# Patient Record
Sex: Female | Born: 1945 | Race: White | Hispanic: No | Marital: Married | Smoking: Never smoker
Health system: Southern US, Community
[De-identification: ages and names within clinical notes are randomized; demographics above are authoritative.]

## PROBLEM LIST (undated history)

## (undated) DIAGNOSIS — I10 Essential (primary) hypertension: Secondary | ICD-10-CM

## (undated) DIAGNOSIS — E7800 Pure hypercholesterolemia, unspecified: Secondary | ICD-10-CM

## (undated) HISTORY — PX: MELANOMA EXCISION: SHX5266

## (undated) HISTORY — PX: WISDOM TOOTH EXTRACTION: SHX21

## (undated) HISTORY — PX: LIPOMA EXCISION: SHX5283

## (undated) HISTORY — PX: OTHER SURGICAL HISTORY: SHX169

---

## 2012-09-05 ENCOUNTER — Ambulatory Visit: Payer: Self-pay | Admitting: Emergency Medicine

## 2015-02-11 ENCOUNTER — Encounter: Payer: Self-pay | Admitting: *Deleted

## 2015-02-11 ENCOUNTER — Ambulatory Visit
Admission: EM | Admit: 2015-02-11 | Discharge: 2015-02-11 | Disposition: A | Discharge status: Home / Self Care | Payer: Medicare Other | Attending: Family Medicine | Admitting: Family Medicine

## 2015-02-11 DIAGNOSIS — R05 Cough: Secondary | ICD-10-CM

## 2015-02-11 DIAGNOSIS — J0100 Acute maxillary sinusitis, unspecified: Secondary | ICD-10-CM

## 2015-02-11 DIAGNOSIS — R059 Cough, unspecified: Secondary | ICD-10-CM

## 2015-02-11 HISTORY — DX: Pure hypercholesterolemia, unspecified: E78.00

## 2015-02-11 HISTORY — DX: Essential (primary) hypertension: I10

## 2015-02-11 MED ORDER — HYDROCOD POLST-CPM POLST ER 10-8 MG/5ML PO SUER: 5.0000 mL | Freq: Every evening | ORAL | Status: DC | PRN

## 2015-02-11 MED ORDER — AZITHROMYCIN 250 MG PO TABS: ORAL_TABLET | ORAL | Status: DC

## 2015-02-11 NOTE — ED Notes (Signed)
Patient has had symptoms of nasal congestion and cough for two weeks with no relief using OTC drugs. Patient states that the congestion started tightening up last PM and is no longer loose. Color of congestion is clear. No symptoms of sore throat.

## 2015-02-11 NOTE — ED Provider Notes (Signed)
CSN: [id number]     Arrival date & time 02/11/15  0807 History   First MD Initiated Contact with Patient 02/11/15 [phone]     Chief Complaint  Patient presents with  . Nasal Congestion  . Cough   (Consider location/radiation/quality/duration/timing/severity/associated sxs/prior Treatment) Patient is a 69 y.o. female presenting with URI. The history is provided by the patient.  URI Presenting symptoms: congestion, cough and facial pain   Onset quality:  Sudden Duration:  2 weeks Timing:  Constant Progression:  Worsening Chronicity:  New Ineffective treatments:  OTC medications Associated symptoms: sinus pain   Associated symptoms: no headaches     Past Medical History  Diagnosis Date  . Hypertension   . Hypercholesteremia    Past Surgical History  Procedure Laterality Date  . Lipoma excision Right     buttock  . Wisdom tooth extraction     History reviewed. No pertinent family history. Social History  Substance Use Topics  . Smoking status: Never Smoker   . Smokeless tobacco: Never Used  . Alcohol Use: No   OB History    No data available     Review of Systems  HENT: Positive for congestion.   Respiratory: Positive for cough.   Neurological: Negative for headaches.    Allergies  Review of patient's allergies indicates no known allergies.  Home Medications   Prior to Admission medications   Medication Sig Start Date End Date Taking? Authorizing Provider  hydrochlorothiazide (MICROZIDE) 12.5 MG capsule Take 12.5 mg by mouth daily.   Yes Historical Provider, MD  rosuvastatin (CRESTOR) 5 MG tablet Take 5 mg by mouth daily.   Yes Historical Provider, MD  azithromycin (ZITHROMAX Z-PAK) 250 MG tablet 2 tabs po once day 1, then 1 tab po qd for next 4 days 02/11/15   Payton Mccallum, MD  chlorpheniramine-HYDROcodone [facility]) 10-8 MG/5ML SUER Take 5 mLs by mouth at bedtime as needed for cough. 02/11/15   Payton Mccallum, MD   Meds Ordered and  Administered this Visit  Medications - No data to display  BP 164/93 mmHg  Pulse 71  Temp(Src) 97.7 F (36.5 C) (Oral)  Resp 20  Ht 5' 5.5" (1.664 m)  Wt 160 lb (72.576 kg)  BMI 26.21 kg/m2  SpO2 95% No data found.   Physical Exam  Constitutional: She appears well-developed and well-nourished. No distress.  HENT:  Head: Normocephalic and atraumatic.  Right Ear: Tympanic membrane, external ear and ear canal normal.  Left Ear: Tympanic membrane, external ear and ear canal normal.  Nose: Mucosal edema and rhinorrhea present. No nose lacerations, sinus tenderness, nasal deformity, septal deviation or nasal septal hematoma. No epistaxis.  No foreign bodies. Right sinus exhibits maxillary sinus tenderness and frontal sinus tenderness. Left sinus exhibits maxillary sinus tenderness and frontal sinus tenderness.  Mouth/Throat: Uvula is midline, oropharynx is clear and moist and mucous membranes are normal. No oropharyngeal exudate.  Eyes: Conjunctivae and EOM are normal. Pupils are equal, round, and reactive to light. Right eye exhibits no discharge. Left eye exhibits no discharge. No scleral icterus.  Neck: Normal range of motion. Neck supple. No thyromegaly present.  Cardiovascular: Normal rate, regular rhythm and normal heart sounds.   Pulmonary/Chest: Effort normal and breath sounds normal. No respiratory distress. She has no wheezes. She has no rales.  Lymphadenopathy:    She has no cervical adenopathy.  Skin: No rash noted. She is not diaphoretic.  Nursing note and vitals reviewed.   ED Course  Procedures (including  critical care time)  Labs Review Labs Reviewed - No data to display  Imaging Review No results found.   Visual Acuity Review  Right Eye Distance:   Left Eye Distance:   Bilateral Distance:    Right Eye Near:   Left Eye Near:    Bilateral Near:         MDM   1. Acute maxillary sinusitis, recurrence not specified   2. Cough    Discharge Medication  List as of 02/11/2015  9:02 AM    START taking these medications   Details  azithromycin (ZITHROMAX Z-PAK) 250 MG tablet 2 tabs po once day 1, then 1 tab po qd for next 4 days, Normal    chlorpheniramine-HYDROcodone ([facility]) 10-8 MG/5ML SUER Take 5 mLs by mouth at bedtime as needed for cough., Starting 02/11/2015, Until Discontinued, Normal      1.  diagnosis reviewed with patient/parent/guardian/family 2. rx as per orders above; reviewed possible side effects, interactions, risks and benefits  3. Recommend supportive treatment with rest, increased fluids  4. Follow-up prn if symptoms worsen or don't improve    Payton Mccallumrlando Edelmira Gallogly, MD 02/11/15 [phone]

## 2019-03-11 ENCOUNTER — Ambulatory Visit: Payer: Medicare PPO | Admitting: Family Medicine

## 2019-03-11 ENCOUNTER — Encounter: Payer: Self-pay | Admitting: Family Medicine

## 2019-03-11 ENCOUNTER — Other Ambulatory Visit: Payer: Self-pay

## 2019-03-11 VITALS — BP 150/100 | HR 64 | Ht 65.5 in | Wt 149.0 lb

## 2019-03-11 DIAGNOSIS — Z85820 Personal history of malignant melanoma of skin: Secondary | ICD-10-CM

## 2019-03-11 DIAGNOSIS — I10 Essential (primary) hypertension: Secondary | ICD-10-CM | POA: Diagnosis not present

## 2019-03-11 DIAGNOSIS — Z7689 Persons encountering health services in other specified circumstances: Secondary | ICD-10-CM

## 2019-03-11 DIAGNOSIS — Z23 Encounter for immunization: Secondary | ICD-10-CM

## 2019-03-11 DIAGNOSIS — E7801 Familial hypercholesterolemia: Secondary | ICD-10-CM | POA: Diagnosis not present

## 2019-03-11 DIAGNOSIS — R69 Illness, unspecified: Secondary | ICD-10-CM

## 2019-03-11 MED ORDER — FISH OIL 435 MG PO CAPS: 1.0000 | ORAL_CAPSULE | Freq: Two times a day (BID) | ORAL | 11 refills | Status: AC

## 2019-03-11 MED ORDER — HYDROCHLOROTHIAZIDE 12.5 MG PO CAPS: 12.5000 mg | ORAL_CAPSULE | Freq: Every day | ORAL | 1 refills | Status: DC

## 2019-03-11 MED ORDER — ROSUVASTATIN CALCIUM 5 MG PO TABS: 5.0000 mg | ORAL_TABLET | Freq: Every day | ORAL | 1 refills | Status: DC

## 2019-03-11 NOTE — Progress Notes (Signed)
Date:  03/11/2019   Name:  Amy Gonzales   DOB:  Sep 06, 1945   MRN:  [id number]   Chief Complaint: Establish Care, ref derm (yearly skin checks), and pneum 23  Patient is a 74 year old female who presents for a establihment  New patient exam. The patient reports the following problems: htn/hyperlipedia. Health maintenance has been reviewed pneumococcal.  Hypertension This is a chronic problem. The current episode started more than 1 year ago. The problem has been waxing and waning since onset. The problem is controlled (hx white coat syndrome). Pertinent negatives include no anxiety, blurred vision, chest pain, headaches, malaise/fatigue, neck pain, orthopnea, palpitations, peripheral edema, PND, shortness of breath or sweats. There are no associated agents to hypertension. Risk factors for coronary artery disease include dyslipidemia and post-menopausal state. There is no history of angina, kidney disease, CAD/MI, CVA, heart failure, left ventricular hypertrophy, PVD or retinopathy. There is no history of chronic renal disease, a hypertension causing med or renovascular disease.  Hyperlipidemia This is a chronic problem. The current episode started more than 1 year ago. The problem is controlled. Recent lipid tests were reviewed and are normal. She has no history of chronic renal disease, diabetes, hypothyroidism, liver disease, obesity or nephrotic syndrome. Factors aggravating her hyperlipidemia include thiazides. Pertinent negatives include no chest pain, focal sensory loss, focal weakness, leg pain, myalgias or shortness of breath. Current antihyperlipidemic treatment includes statins. The current treatment provides moderate improvement of lipids. There are no compliance problems.  Risk factors for coronary artery disease include dyslipidemia and hypertension.    No results found for: CREATININE, BUN, NA, K, CL, CO2 No results found for: CHOL, HDL, LDLCALC, LDLDIRECT, TRIG, CHOLHDL No  results found for: TSH No results found for: HGBA1C   Review of Systems  Constitutional: Negative.  Negative for chills, fatigue, fever, malaise/fatigue and unexpected weight change.  HENT: Negative for congestion, ear discharge, ear pain, rhinorrhea, sinus pressure, sneezing and sore throat.   Eyes: Negative for blurred vision, photophobia, pain, discharge, redness and itching.  Respiratory: Negative for cough, shortness of breath, wheezing and stridor.   Cardiovascular: Negative for chest pain, palpitations, orthopnea, leg swelling and PND.  Gastrointestinal: Negative for abdominal pain, blood in stool, constipation, diarrhea, nausea and vomiting.  Endocrine: Negative for cold intolerance, heat intolerance, polydipsia, polyphagia and polyuria.  Genitourinary: Negative for dysuria, flank pain, frequency, hematuria, menstrual problem, pelvic pain, urgency, vaginal bleeding and vaginal discharge.  Musculoskeletal: Negative for arthralgias, back pain, myalgias and neck pain.  Skin: Negative for rash.  Allergic/Immunologic: Negative for environmental allergies and food allergies.  Neurological: Negative for dizziness, focal weakness, weakness, light-headedness, numbness and headaches.  Hematological: Negative for adenopathy. Does not bruise/bleed easily.  Psychiatric/Behavioral: Negative for dysphoric mood. The patient is not nervous/anxious.     There are no problems to display for this patient.   No Known Allergies  Past Surgical History:  Procedure Laterality Date  . 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 LIPOMA EXCISION Right    buttock  . MELANOMA EXCISION     face  . WISDOM TOOTH EXTRACTION      Social History   Tobacco Use  . Smoking status: Never Smoker  . Smokeless tobacco: Never Used  Substance Use Topics  . Alcohol use: No  . Drug use: No     Medication list has been reviewed and updated.  Current Meds  Medication Sig  . cholecalciferol (VITAMIN D3) 25  MCG (  1000 UNIT) tablet Take 1,000 Units by mouth daily.  . Coenzyme Q10 (COQ10) 100 MG CAPS Take 1 capsule by mouth daily.  . Evening Primrose Oil 1000 MG CAPS Take 1 capsule by mouth 2 (two) times daily.  . folic acid (FOLVITE) 400 MCG tablet Take 400 mcg by mouth daily.  . Glucos-Chondroit-Hyaluron-MSM (GLUCOSAMINE CHONDROITIN JOINT PO) Take 1 tablet by mouth 2 (two) times daily.  . hydrochlorothiazide (MICROZIDE) 12.5 MG capsule Take 12.5 mg by mouth daily.  . Magnesium 250 MG TABS Take 1 tablet by mouth daily.  . Multiple Vitamins-Minerals (CENTRUM SILVER PO) Take 1 tablet by mouth daily.  . Omega-3 Fatty Acids (FISH OIL) 435 MG CAPS Take 1 capsule by mouth 2 (two) times daily.  . rosuvastatin (CRESTOR) 5 MG tablet Take 5 mg by mouth daily.    PHQ 2/9 Scores 03/11/2019  PHQ - 2 Score 0  PHQ- 9 Score 0    BP Readings from Last 3 Encounters:  03/11/19 (!) 150/100  02/11/15 (!) 164/93    Physical Exam Vitals and nursing note reviewed.  Constitutional:      General: She is not in acute distress.    Appearance: She is not diaphoretic.  HENT:     Head: Normocephalic and atraumatic.     Right Ear: Tympanic membrane, ear canal and external ear normal.     Left Ear: Tympanic membrane, ear canal and external ear normal.     Nose: Nose normal.  Eyes:     General:        Right eye: No discharge.        Left eye: No discharge.     Conjunctiva/sclera: Conjunctivae normal.     Pupils: Pupils are equal, round, and reactive to light.  Neck:     Thyroid: No thyromegaly.     Vascular: No JVD.  Cardiovascular:     Rate and Rhythm: Normal rate and regular rhythm.     Pulses: Normal pulses.     Heart sounds: Normal heart sounds. No murmur. No friction rub. No gallop.   Pulmonary:     Effort: Pulmonary effort is normal. No respiratory distress.     Breath sounds: Normal breath sounds. No stridor. No wheezing, rhonchi or rales.  Chest:     Chest wall: No tenderness.  Abdominal:      General: Bowel sounds are normal.     Palpations: Abdomen is soft. There is no mass.     Tenderness: There is no abdominal tenderness. There is no guarding.  Musculoskeletal:        General: Normal range of motion.     Cervical back: Normal range of motion and neck supple.  Lymphadenopathy:     Cervical: No cervical adenopathy.  Skin:    General: Skin is warm and dry.     Capillary Refill: Capillary refill takes less than 2 seconds.     Findings: No bruising or erythema.  Neurological:     Mental Status: She is alert.     Deep Tendon Reflexes: Reflexes are normal and symmetric.     Wt Readings from Last 3 Encounters:  03/11/19 149 lb (67.6 kg)  02/11/15 160 lb (72.6 kg)    BP (!) 150/100   Pulse 64   Ht 5' 5.5" (1.664 m)   Wt 149 lb (67.6 kg)   BMI 24.42 kg/m   Assessment and Plan: 1. Establishing care with new doctor, encounter for No subjective/objective concerns with review of patient's history and physical.  Patient's relatively sparse previous encounters were evaluated there is no labs, imaging, specialty notes to review.  2. Essential hypertension Chronic.  Controlled.  Stable.  Continue hydrochlorothiazide 12.5 mg once a day.  Patient does relate that she has a history of whitecoat syndrome and always had an elevated reading in physician's offices but is able to provide normal blood pressure readings at home.  We will refill hydrochlorothiazide 12.5 mg once a day and have discussed the possibility of investigating with ambulatory blood pressure readings. - Omega-3 Fatty Acids (FISH OIL) 435 MG CAPS; Take 1 capsule (435 mg total) by mouth 2 (two) times daily.  Dispense: 60 capsule; Refill: 11 - hydrochlorothiazide (MICROZIDE) 12.5 MG capsule; Take 1 capsule (12.5 mg total) by mouth daily.  Dispense: 90 capsule; Refill: 1 - Renal Function Panel  3. Familial hypercholesterolemia Chronic.  Controlled.  Stable.  Patient is currently on rosuvastatin 5 mg once a day.  We will  check a lipid panel for evaluation of results. - rosuvastatin (CRESTOR) 5 MG tablet; Take 1 tablet (5 mg total) by mouth daily.  Dispense: 90 tablet; Refill: 1 - Lipid Panel With LDL/HDL Ratio  4. 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  Patient desires a referral to dermatology and Dr. Cheree Ditto has been consulted for evaluation of mole checks and treatment if necessary. - Ambulatory referral to Dermatology  5. Taking medication for chronic disease Patient currently on several medications including a statin for which we will evaluate hepatic function. - Hepatic function panel  6. Need for pneumococcal vaccination Discussed and administered patient has had a pneumococcal 13 but we cannot find evidence of a 23 in the past. - Pneumococcal polysaccharide vaccine 23-valent greater than or equal to 2yo subcutaneous/IM

## 2019-03-12 LAB — RENAL FUNCTION PANEL
Albumin: 4.5 g/dL (ref 3.7–4.7)
BUN/Creatinine Ratio: 16 (ref 12–28)
BUN: 12 mg/dL (ref 8–27)
CO2: 24 mmol/L (ref 20–29)
Calcium: 10.1 mg/dL (ref 8.7–10.3)
Chloride: 98 mmol/L (ref 96–106)
Creatinine, Ser: 0.75 mg/dL (ref 0.57–1.00)
GFR calc Af Amer: 91 mL/min/{1.73_m2} (ref >59)
GFR calc non Af Amer: 79 mL/min/{1.73_m2} (ref >59)
Glucose: 90 mg/dL (ref 65–99)
Phosphorus: 3.6 mg/dL (ref 3.0–4.3)
Potassium: 4 mmol/L (ref 3.5–5.2)
Sodium: 138 mmol/L (ref 134–144)

## 2019-03-12 LAB — HEPATIC FUNCTION PANEL
ALT: 13 IU/L (ref 0–32)
AST: 18 IU/L (ref 0–40)
Alkaline Phosphatase: 57 IU/L (ref 39–117)
Bilirubin Total: 0.3 mg/dL (ref 0.0–1.2)
Bilirubin, Direct: 0.11 mg/dL (ref 0.00–0.40)
Total Protein: 6.5 g/dL (ref 6.0–8.5)

## 2019-03-12 LAB — LIPID PANEL WITH LDL/HDL RATIO
Cholesterol, Total: 183 mg/dL (ref 100–199)
HDL: 85 mg/dL (ref >39)
LDL Chol Calc (NIH): 78 mg/dL (ref 0–99)
LDL/HDL Ratio: 0.9 ratio (ref 0.0–3.2)
Triglycerides: 116 mg/dL (ref 0–149)
VLDL Cholesterol Cal: 20 mg/dL (ref 5–40)

## 2019-03-26 ENCOUNTER — Other Ambulatory Visit: Payer: Self-pay

## 2019-03-26 ENCOUNTER — Ambulatory Visit: Payer: Self-pay | Attending: Internal Medicine

## 2019-03-26 DIAGNOSIS — Z23 Encounter for immunization: Secondary | ICD-10-CM | POA: Insufficient documentation

## 2019-03-26 NOTE — Progress Notes (Signed)
   Covid-19 Vaccination Clinic  Name:  Amy Gonzales    MRN: [id number] DOB: November 01, 1945  03/26/2019  Amy Gonzales was observed post Covid-19 immunization for 15 minutes without incidence. She was provided with Vaccine Information Sheet and instruction to access the V-Safe system.   Amy Gonzales was instructed to call 911 with any severe reactions post vaccine: Marland Kitchen Difficulty breathing  . Swelling of your face and throat  . A fast heartbeat  . A bad rash all over your body  . Dizziness and weakness    Immunizations Administered    Name Date Dose VIS Date Route   Moderna COVID-19 Vaccine 03/26/2019  5:15 PM 0.5 mL 01/19/2019 Intramuscular   Manufacturer: Moderna   Lot: [id number]   NDC: 88416-606-30

## 2019-04-27 ENCOUNTER — Ambulatory Visit: Payer: Self-pay | Attending: Internal Medicine

## 2019-04-27 DIAGNOSIS — Z23 Encounter for immunization: Secondary | ICD-10-CM | POA: Insufficient documentation

## 2019-04-27 NOTE — Progress Notes (Signed)
   Covid-19 Vaccination Clinic  Name:  Amy Gonzales    MRN: [id number] DOB: [birth year]  04/27/2019  [name] was observed post Covid-19 immunization for 15 minutes without incident. She was provided with Vaccine Information Sheet and instruction to access the V-Safe system.   [name] was instructed to call 911 with any severe reactions post vaccine: Marland Kitchen Difficulty breathing  . Swelling of face and throat  . A fast heartbeat  . A bad rash all over body  . Dizziness and weakness   Immunizations Administered    Name Date Dose VIS Date Route   Moderna COVID-19 Vaccine 04/27/2019  4:22 PM 0.5 mL 01/19/2019 Intramuscular   Manufacturer: Moderna   Lot: [id number]   NDC: 14840-397-95

## 2019-05-04 ENCOUNTER — Encounter: Payer: Self-pay | Admitting: Family Medicine

## 2019-05-04 ENCOUNTER — Ambulatory Visit: Payer: Medicare PPO | Admitting: Family Medicine

## 2019-05-04 ENCOUNTER — Other Ambulatory Visit: Payer: Self-pay

## 2019-05-04 VITALS — BP 160/100 | HR 88 | Ht 65.5 in | Wt 148.0 lb

## 2019-05-04 DIAGNOSIS — L309 Dermatitis, unspecified: Secondary | ICD-10-CM

## 2019-05-04 MED ORDER — PREDNISONE 10 MG PO TABS: ORAL_TABLET | ORAL | 1 refills | Status: DC

## 2019-05-04 MED ORDER — TRIAMCINOLONE ACETONIDE 0.1 % EX CREA: 1.0000 "application " | TOPICAL_CREAM | Freq: Two times a day (BID) | CUTANEOUS | 0 refills | Status: DC

## 2019-05-04 NOTE — Progress Notes (Signed)
Date:  05/04/2019   Name:  Amy Gonzales   DOB:  October 09, 1945   MRN:  [id number]   Chief Complaint: Rash (pulled weeds and yard clearing- red, raised areas in some cluster- like shapes)  Rash This is a new problem. The current episode started in the past 7 days. The problem has been gradually worsening since onset. The affected locations include the left arm and abdomen. The rash is characterized by itchiness and redness. Associated with: vegetative clippings. Pertinent negatives include no anorexia, congestion, cough, diarrhea, eye pain, facial edema, fatigue, fever, joint pain, nail changes, rhinorrhea, shortness of breath, sore throat or vomiting.    Lab Results  Component Value Date   CREATININE 0.75 03/11/2019   BUN 12 03/11/2019   NA 138 03/11/2019   K 4.0 03/11/2019   CL 98 03/11/2019   CO2 24 03/11/2019   Lab Results  Component Value Date   CHOL 183 03/11/2019   HDL 85 03/11/2019   LDLCALC 78 03/11/2019   TRIG 116 03/11/2019   No results found for: TSH No results found for: HGBA1C No results found for: WBC, HGB, HCT, MCV, PLT Lab Results  Component Value Date   ALT 13 03/11/2019   AST 18 03/11/2019   ALKPHOS 57 03/11/2019   BILITOT 0.3 03/11/2019     Review of Systems  Constitutional: Negative.  Negative for chills, fatigue, fever and unexpected weight change.  HENT: Negative for congestion, ear discharge, ear pain, rhinorrhea, sinus pressure, sneezing and sore throat.   Eyes: Negative for photophobia, pain, discharge, redness and itching.  Respiratory: Negative for cough, shortness of breath, wheezing and stridor.   Gastrointestinal: Negative for abdominal pain, anorexia, blood in stool, constipation, diarrhea, nausea and vomiting.  Endocrine: Negative for cold intolerance, heat intolerance, polydipsia, polyphagia and polyuria.  Genitourinary: Negative for dysuria, flank pain, frequency, hematuria, menstrual problem, pelvic pain, urgency, vaginal bleeding and  vaginal discharge.  Musculoskeletal: Negative for arthralgias, back pain, joint pain and myalgias.  Skin: Positive for rash. Negative for nail changes.  Allergic/Immunologic: Negative for environmental allergies and food allergies.  Neurological: Negative for dizziness, weakness, light-headedness, numbness and headaches.  Hematological: Negative for adenopathy. Does not bruise/bleed easily.  Psychiatric/Behavioral: Negative for dysphoric mood. The patient is not nervous/anxious.     There are no problems to display for this patient.   No Known Allergies  Past Surgical History:  Procedure Laterality Date  . 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 LIPOMA EXCISION Right    buttock  . MELANOMA EXCISION     face  . WISDOM TOOTH EXTRACTION      Social History   Tobacco Use  . Smoking status: Never Smoker  . Smokeless tobacco: Never Used  Substance Use Topics  . Alcohol use: No  . Drug use: No     Medication list has been reviewed and updated.  Current Meds  Medication Sig  . cholecalciferol (VITAMIN D3) 25 MCG (1000 UNIT) tablet Take 1,000 Units by mouth daily.  . Coenzyme Q10 (COQ10) 100 MG CAPS Take 1 capsule by mouth daily.  . Evening Primrose Oil 1000 MG CAPS Take 1 capsule by mouth 2 (two) times daily.  . folic acid (FOLVITE) 400 MCG tablet Take 400 mcg by mouth daily.  . Glucos-Chondroit-Hyaluron-MSM (GLUCOSAMINE CHONDROITIN JOINT PO) Take 1 tablet by mouth 2 (two) times daily.  . hydrochlorothiazide (MICROZIDE) 12.5 MG capsule Take 1 capsule (12.5 mg total) by mouth daily.  . Magnesium  250 MG TABS Take 1 tablet by mouth daily.  . Multiple Vitamins-Minerals (CENTRUM SILVER PO) Take 1 tablet by mouth daily.  . Omega-3 Fatty Acids (FISH OIL) 435 MG CAPS Take 1 capsule (435 mg total) by mouth 2 (two) times daily.  . rosuvastatin (CRESTOR) 5 MG tablet Take 1 tablet (5 mg total) by mouth daily.    PHQ 2/9 Scores 03/11/2019  PHQ - 2 Score 0  PHQ- 9 Score 0    BP  Readings from Last 3 Encounters:  05/04/19 (!) 160/100  03/11/19 (!) 150/100  02/11/15 (!) 164/93    Physical Exam Vitals and nursing note reviewed.  Constitutional:      General: She is not in acute distress.    Appearance: She is not diaphoretic.  HENT:     Head: Normocephalic and atraumatic.     Right Ear: Tympanic membrane, ear canal and external ear normal.     Left Ear: Tympanic membrane, ear canal and external ear normal.     Nose: Nose normal. No congestion or rhinorrhea.     Mouth/Throat:     Mouth: Mucous membranes are moist.  Eyes:     General:        Right eye: No discharge.        Left eye: No discharge.     Conjunctiva/sclera: Conjunctivae normal.     Pupils: Pupils are equal, round, and reactive to light.  Neck:     Thyroid: No thyromegaly.     Vascular: No JVD.  Cardiovascular:     Rate and Rhythm: Normal rate and regular rhythm.     Heart sounds: Normal heart sounds. No murmur. No friction rub. No gallop.   Pulmonary:     Effort: Pulmonary effort is normal.     Breath sounds: Normal breath sounds. No wheezing or rhonchi.  Abdominal:     General: Bowel sounds are normal.     Palpations: Abdomen is soft. There is no mass.     Tenderness: There is no abdominal tenderness. There is no guarding.  Musculoskeletal:        General: Normal range of motion.     Cervical back: Normal range of motion and neck supple.  Lymphadenopathy:     Cervical: No cervical adenopathy.  Skin:    General: Skin is warm and dry.     Capillary Refill: Capillary refill takes less than 2 seconds.     Findings: Erythema and rash present.  Neurological:     Mental Status: She is alert.     Deep Tendon Reflexes: Reflexes are normal and symmetric.     Wt Readings from Last 3 Encounters:  05/04/19 148 lb (67.1 kg)  03/11/19 149 lb (67.6 kg)  02/11/15 160 lb (72.6 kg)    BP (!) 160/100   Pulse 88   Ht 5' 5.5" (1.664 m)   Wt 148 lb (67.1 kg)   BMI 24.25 kg/m   Assessment  and Plan: 1. Dermatitis Acute.  Persistent.  Patient has areas on her arms face and across abdomen is suggestive of contact dermatitis but does not know of any exposure to poison oak although she has been working out in the yard I would not be surprised this is the underlying cause or of an insect nature.  We will use triamcinolone cream 0.1% and I rather doubt that this is going to take care of it but patient would like to try this first because she is cautious about the same.  In  the meantime I will give her a printed prescription for prednisone taper beginning at 60 mg to taper over 2 weeks as her backup.  For itching patient has been instructed to use a nonsedating antihistamine like loratadine during the day and Benadryl 25 mg at night. - triamcinolone cream (KENALOG) 0.1 %; Apply 1 application topically 2 (two) times daily.  Dispense: 453.6 g; Refill: 0   Addendum patient's blood pressure was noted to be elevated again and she feels that it is whitecoat directed.  I rather doubt that it is entirely that I think she does have some underlying hypertension but I checked her GFR and creatinine and they are well within normal limits certainly for her age therefore we will keep an eye on this and do Dash diet and watch her sodium and treated from the standpoint of commonsense dietary means.

## 2019-06-10 DIAGNOSIS — L578 Other skin changes due to chronic exposure to nonionizing radiation: Secondary | ICD-10-CM | POA: Diagnosis not present

## 2019-06-10 DIAGNOSIS — Z85820 Personal history of malignant melanoma of skin: Secondary | ICD-10-CM | POA: Diagnosis not present

## 2019-06-10 DIAGNOSIS — L2389 Allergic contact dermatitis due to other agents: Secondary | ICD-10-CM | POA: Diagnosis not present

## 2019-06-10 DIAGNOSIS — L570 Actinic keratosis: Secondary | ICD-10-CM | POA: Diagnosis not present

## 2019-09-09 ENCOUNTER — Other Ambulatory Visit: Payer: Self-pay

## 2019-09-09 ENCOUNTER — Encounter: Payer: Self-pay | Admitting: Family Medicine

## 2019-09-09 ENCOUNTER — Ambulatory Visit: Payer: Medicare PPO | Admitting: Family Medicine

## 2019-09-09 VITALS — BP 130/80 | HR 80 | Ht 65.5 in | Wt 148.0 lb

## 2019-09-09 DIAGNOSIS — J0100 Acute maxillary sinusitis, unspecified: Secondary | ICD-10-CM | POA: Diagnosis not present

## 2019-09-09 MED ORDER — AZITHROMYCIN 250 MG PO TABS: ORAL_TABLET | ORAL | 0 refills | Status: DC

## 2019-09-09 NOTE — Progress Notes (Signed)
Date:  09/09/2019   Name:  Amy Gonzales   DOB:  Aug 08, 1945   MRN:  [id number]   Chief Complaint: Sinusitis (head feels heavy and full, no drainage, pressure to L) ear. no cough- trying Sudafed otc )  Sinusitis This is a new problem. The current episode started in the past 7 days. The problem has been waxing and waning since onset. There has been no fever. The pain is moderate. Associated symptoms include congestion, ear pain and sinus pressure. Pertinent negatives include no chills, coughing, diaphoresis, headaches, hoarse voice, neck pain, shortness of breath, sneezing, sore throat or swollen glands. Past treatments include oral decongestants and saline sprays. The treatment provided mild relief.    Lab Results  Component Value Date   CREATININE 0.75 03/11/2019   BUN 12 03/11/2019   NA 138 03/11/2019   K 4.0 03/11/2019   CL 98 03/11/2019   CO2 24 03/11/2019   Lab Results  Component Value Date   CHOL 183 03/11/2019   HDL 85 03/11/2019   LDLCALC 78 03/11/2019   TRIG 116 03/11/2019   No results found for: TSH No results found for: HGBA1C No results found for: WBC, HGB, HCT, MCV, PLT Lab Results  Component Value Date   ALT 13 03/11/2019   AST 18 03/11/2019   ALKPHOS 57 03/11/2019   BILITOT 0.3 03/11/2019     Review of Systems  Constitutional: Negative.  Negative for chills, diaphoresis, fatigue, fever and unexpected weight change.  HENT: Positive for congestion, ear pain and sinus pressure. Negative for ear discharge, hoarse voice, rhinorrhea, sneezing and sore throat.   Eyes: Negative for photophobia, pain, discharge, redness and itching.  Respiratory: Negative for cough, shortness of breath, wheezing and stridor.   Cardiovascular: Negative for chest pain, palpitations and leg swelling.  Gastrointestinal: Negative for abdominal pain, blood in stool, constipation, diarrhea, nausea and vomiting.  Endocrine: Negative for cold intolerance, heat intolerance, polydipsia,  polyphagia and polyuria.  Genitourinary: Negative for dysuria, flank pain, frequency, hematuria, menstrual problem, pelvic pain, urgency, vaginal bleeding and vaginal discharge.  Musculoskeletal: Negative for arthralgias, back pain, myalgias and neck pain.  Skin: Negative for rash.  Allergic/Immunologic: Negative for environmental allergies and food allergies.  Neurological: Negative for dizziness, weakness, light-headedness, numbness and headaches.  Hematological: Negative for adenopathy. Does not bruise/bleed easily.  Psychiatric/Behavioral: Negative for dysphoric mood. The patient is not nervous/anxious.     There are no problems to display for this patient.   No Known Allergies  Past Surgical History:  Procedure Laterality Date  . 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 LIPOMA EXCISION Right    buttock  . MELANOMA EXCISION     face  . WISDOM TOOTH EXTRACTION      Social History   Tobacco Use  . Smoking status: Never Smoker  . Smokeless tobacco: Never Used  Substance Use Topics  . Alcohol use: No  . Drug use: No     Medication list has been reviewed and updated.  Current Meds  Medication Sig  . cholecalciferol (VITAMIN D3) 25 MCG (1000 UNIT) tablet Take 1,000 Units by mouth daily.  . Coenzyme Q10 (COQ10) 100 MG CAPS Take 1 capsule by mouth daily.  . Evening Primrose Oil 1000 MG CAPS Take 1 capsule by mouth 2 (two) times daily.  . folic acid (FOLVITE) 400 MCG tablet Take 400 mcg by mouth daily.  . Glucos-Chondroit-Hyaluron-MSM (GLUCOSAMINE CHONDROITIN JOINT PO) Take 1 tablet by mouth 2 (two)  times daily.  . hydrochlorothiazide (MICROZIDE) 12.5 MG capsule Take 1 capsule (12.5 mg total) by mouth daily.  . Magnesium 250 MG TABS Take 1 tablet by mouth daily.  . Multiple Vitamins-Minerals (CENTRUM SILVER PO) Take 1 tablet by mouth daily.  . Omega-3 Fatty Acids (FISH OIL) 435 MG CAPS Take 1 capsule (435 mg total) by mouth 2 (two) times daily.  . rosuvastatin (CRESTOR) 5  MG tablet Take 1 tablet (5 mg total) by mouth daily.  Marland Kitchen triamcinolone cream (KENALOG) 0.1 % Apply 1 application topically 2 (two) times daily.    PHQ 2/9 Scores 09/09/2019 03/11/2019  PHQ - 2 Score 0 0  PHQ- 9 Score 0 0    GAD 7 : Generalized Anxiety Score 09/09/2019 03/11/2019  Nervous, Anxious, on Edge 0 0  Control/stop worrying 0 0  Worry too much - different things 0 0  Trouble relaxing 0 0  Restless 0 0  Easily annoyed or irritable 0 0  Afraid - awful might happen 0 0  Total GAD 7 Score 0 0    BP Readings from Last 3 Encounters:  09/09/19 (!) 130/80  05/04/19 (!) 160/100  03/11/19 (!) 150/100    Physical Exam  Wt Readings from Last 3 Encounters:  09/09/19 148 lb (67.1 kg)  05/04/19 148 lb (67.1 kg)  03/11/19 149 lb (67.6 kg)    BP (!) 130/80   Pulse 80   Ht 5' 5.5" (1.664 m)   Wt 148 lb (67.1 kg)   BMI 24.25 kg/m   Assessment and Plan: 1. Acute non-recurrent maxillary sinusitis Acute.  Persistent.  Uncomplicated and relatively stable.  Will initiate a azithromycin to 50 mg 2 today followed by 1 a day for 4 days.  Patient will continue Sudafed for decongestant induced swelling.  Patient is also noted to have eustachian tube dysfunction with a retracted TM on the left - azithromycin (ZITHROMAX) 250 MG tablet; 2 today then 1 a day for 4 days  Dispense: 6 tablet; Refill: 0

## 2019-09-23 ENCOUNTER — Telehealth: Payer: Self-pay | Admitting: Family Medicine

## 2019-09-23 NOTE — Telephone Encounter (Signed)
Left message for patient to call back and schedule Medicare Annual Wellness Visit (AWV) either virtually/audio only or in office. Whichever the patients preference is.  No history of AWV; please schedule at anytime with [facility] Health Advisor.  This should be a 40 minute visit 

## 2019-09-28 ENCOUNTER — Other Ambulatory Visit: Payer: Self-pay

## 2019-09-28 ENCOUNTER — Ambulatory Visit
Admission: EM | Admit: 2019-09-28 | Discharge: 2019-09-28 | Disposition: A | Discharge status: Home / Self Care | Payer: Medicare PPO | Attending: Internal Medicine | Admitting: Internal Medicine

## 2019-09-28 ENCOUNTER — Ambulatory Visit: Payer: Self-pay

## 2019-09-28 DIAGNOSIS — R079 Chest pain, unspecified: Secondary | ICD-10-CM | POA: Diagnosis not present

## 2019-09-28 DIAGNOSIS — R1012 Left upper quadrant pain: Secondary | ICD-10-CM

## 2019-09-28 DIAGNOSIS — H6982 Other specified disorders of Eustachian tube, left ear: Secondary | ICD-10-CM

## 2019-09-28 MED ORDER — POLYETHYLENE GLYCOL 3350 17 G PO PACK: 17.0000 g | PACK | Freq: Every day | ORAL | 0 refills | Status: DC | PRN

## 2019-09-28 MED ORDER — SIMETHICONE 80 MG PO CHEW: 80.0000 mg | CHEWABLE_TABLET | Freq: Four times a day (QID) | ORAL | 0 refills | Status: DC | PRN

## 2019-09-28 NOTE — ED Triage Notes (Signed)
Patient in today w/ c/o epigastric pain and pain in left side of face below her ear. Patient states her neck "got hot and sweaty.' Sx onset last evening. Patient denies SOB, chest pain, loss of taste or smell.

## 2019-09-29 NOTE — ED Provider Notes (Addendum)
[facility]    CSN: [id number] Arrival date & time: 09/28/19  1351      History   Chief Complaint Chief Complaint  Patient presents with  . Abdominal Pain  . Facial Pain    Below left ear    HPI Amy Gonzales is a 74 y.o. female comes to the urgent care with complaints of epigastric pain, left-sided neck pain with some postnasal drainage.  Symptoms started last evening.  She comes to the urgent care to be evaluated.  Patient denies any chest pain or chest pressure.  No loss of taste or smell.  No fever or chills.  No shortness of breath.  Patient is fully vaccinated against COVID-19 virus.  She was worried about the possibility of heart attack and hence the visit.  Regarding the abdominal pain patient says that she feels more gassy.  She has had small bowel movements over the past several days.  She denies any abdominal distention, nausea or vomiting.  No weight loss.   HPI  Past Medical History:  Diagnosis Date  . Hypercholesteremia   . Hypertension     There are no problems to display for this patient.   Past Surgical History:  Procedure Laterality Date  . 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 LIPOMA EXCISION Right    buttock  . MELANOMA EXCISION     face  . WISDOM TOOTH EXTRACTION      OB History   No obstetric history on file.      Home Medications    Prior to Admission medications   Medication Sig Start Date End Date Taking? Authorizing Provider  cholecalciferol (VITAMIN D3) 25 MCG (1000 UNIT) tablet Take 1,000 Units by mouth daily.   Yes [provider]  Coenzyme Q10 (COQ10) 100 MG CAPS Take 1 capsule by mouth daily.   Yes [provider]  Evening Primrose Oil 1000 MG CAPS Take 1 capsule by mouth 2 (two) times daily.   Yes [provider]  folic acid (FOLVITE) 400 MCG tablet Take 400 mcg by mouth daily.   Yes [provider]  Glucos-Chondroit-Hyaluron-MSM (GLUCOSAMINE CHONDROITIN JOINT PO) Take 1 tablet  by mouth 2 (two) times daily.   Yes [provider]  hydrochlorothiazide (MICROZIDE) 12.5 MG capsule Take 1 capsule (12.5 mg total) by mouth daily. 03/11/19  Yes Duanne Limerick, MD  Magnesium 250 MG TABS Take 1 tablet by mouth daily.   Yes [provider]  Multiple Vitamins-Minerals (CENTRUM SILVER PO) Take 1 tablet by mouth daily.   Yes [provider]  Omega-3 Fatty Acids (FISH OIL) 435 MG CAPS Take 1 capsule (435 mg total) by mouth 2 (two) times daily. 03/11/19  Yes Duanne Limerick, MD  rosuvastatin (CRESTOR) 5 MG tablet Take 1 tablet (5 mg total) by mouth daily. 03/11/19  Yes Duanne Limerick, MD  polyethylene glycol (MIRALAX) 17 g packet Take 17 g by mouth daily as needed for moderate constipation. 09/28/19   Mando Blatz, Britta Mccreedy, MD  simethicone (GAS-X) 80 MG chewable tablet Chew 1 tablet (80 mg total) by mouth every 6 (six) hours as needed for flatulence. 09/28/19   LampteyBritta Mccreedy, MD    Family History Family History  Problem Relation Age of Onset  . Cancer Paternal Grandfather     Social History Social History   Tobacco Use  . Smoking status: Never Smoker  . Smokeless tobacco: Never Used  Substance Use Topics  . Alcohol  use: No  . Drug use: No     Allergies   Patient has no known allergies.   Review of Systems Review of Systems  Constitutional: Negative.   Respiratory: Negative.  Negative for chest tightness, shortness of breath and wheezing.   Cardiovascular: Negative.   Gastrointestinal: Positive for abdominal distention and abdominal pain. Negative for diarrhea, nausea and vomiting.     Physical Exam Triage Vital Signs ED Triage Vitals  Enc Vitals Group     BP 09/28/19 1428 (!) 192/106     Pulse Rate 09/28/19 1428 77     Resp 09/28/19 1428 18     Temp 09/28/19 1428 97.8 F (36.6 C)     Temp Source 09/28/19 1428 Oral     SpO2 09/28/19 1428 100 %     Weight 09/28/19 1434 146 lb (66.2 kg)     Height 09/28/19 1434 5\' 5"  (1.651 m)      Head Circumference --      Peak Flow --      Pain Score 09/28/19 1433 4     Pain Loc --      Pain Edu? --      Excl. in GC? --    No data found.  Updated Vital Signs BP (!) 169/105 (BP Location: Right Arm)   Pulse 66   Temp 97.8 F (36.6 C) (Oral)   Resp 18   Ht 5\' 5"  (1.651 m)   Wt 66.2 kg   SpO2 98%   BMI 24.30 kg/m   Visual Acuity Right Eye Distance:   Left Eye Distance:   Bilateral Distance:    Right Eye Near:   Left Eye Near:    Bilateral Near:     Physical Exam Vitals and nursing note reviewed.  Constitutional:      General: She is not in acute distress.    Appearance: She is not ill-appearing.  HENT:     Mouth/Throat:     Mouth: Mucous membranes are moist.  Eyes:     General: No scleral icterus. Cardiovascular:     Rate and Rhythm: Normal rate and regular rhythm.  Pulmonary:     Effort: Pulmonary effort is normal.     Breath sounds: Normal breath sounds.  Abdominal:     General: Abdomen is flat. There is no distension or abdominal bruit.     Palpations: Abdomen is soft. There is no fluid wave, hepatomegaly or splenomegaly.     Tenderness: There is no abdominal tenderness. There is no right CVA tenderness or left CVA tenderness.  Neurological:     Mental Status: She is alert.      UC Treatments / Results  Labs (all labs ordered are listed, but only abnormal results are displayed) Labs Reviewed - No data to display  EKG   Radiology No results found.  Procedures Procedures (including critical care time)  Medications Ordered in UC Medications - No data to display  Initial Impression / Assessment and Plan / UC Course  I have reviewed the triage vital signs and the nursing notes.  Pertinent labs & imaging results that were available during my care of the patient were reviewed by me and considered in my medical decision making (see chart for details).     1.  Abdominal pain, suspected constipation: EKG shows normal sinus rhythm. MiraLAX  17 g as needed for constipation Simethicone chewable tablets If patient symptoms worsens she is advised to return to the urgent care to be reevaluated If the  abdominal pain worsens she is advised to go to the ED for imaging  2.  Left-sided neck pain-sore throat: Suspected eustachian tube dysfunction: Patient is advised to continue using saline nasal spray If symptoms worsen she is advised to return to urgent care.  Your exam is negative for otitis media. Final Clinical Impressions(s) / UC Diagnoses   Final diagnoses:  Left upper quadrant abdominal pain  Eustachian tube dysfunction, left   Discharge Instructions   None    ED Prescriptions    Medication Sig Dispense Auth. Provider   polyethylene glycol (MIRALAX) 17 g packet Take 17 g by mouth daily as needed for moderate constipation. 14 each Reem Fleury, Britta Mccreedy, MD   simethicone (GAS-X) 80 MG chewable tablet Chew 1 tablet (80 mg total) by mouth every 6 (six) hours as needed for flatulence. 30 tablet Tegh Franek, Britta Mccreedy, MD     PDMP not reviewed this encounter.   Merrilee Jansky, MD 09/29/19 1735    Merrilee Jansky, MD 09/29/19 1736

## 2019-11-09 ENCOUNTER — Other Ambulatory Visit: Payer: Self-pay | Admitting: Family Medicine

## 2019-11-09 DIAGNOSIS — E7801 Familial hypercholesterolemia: Secondary | ICD-10-CM

## 2019-11-25 ENCOUNTER — Other Ambulatory Visit: Payer: Self-pay | Admitting: Obstetrics & Gynecology

## 2019-11-25 DIAGNOSIS — Z1211 Encounter for screening for malignant neoplasm of colon: Secondary | ICD-10-CM | POA: Diagnosis not present

## 2019-11-25 DIAGNOSIS — Z1331 Encounter for screening for depression: Secondary | ICD-10-CM | POA: Diagnosis not present

## 2019-11-25 DIAGNOSIS — Z01411 Encounter for gynecological examination (general) (routine) with abnormal findings: Secondary | ICD-10-CM | POA: Diagnosis not present

## 2019-11-25 DIAGNOSIS — N6001 Solitary cyst of right breast: Secondary | ICD-10-CM | POA: Diagnosis not present

## 2019-11-25 DIAGNOSIS — Z7689 Persons encountering health services in other specified circumstances: Secondary | ICD-10-CM | POA: Diagnosis not present

## 2019-11-25 DIAGNOSIS — Z1239 Encounter for other screening for malignant neoplasm of breast: Secondary | ICD-10-CM | POA: Diagnosis not present

## 2019-12-15 DIAGNOSIS — Z872 Personal history of diseases of the skin and subcutaneous tissue: Secondary | ICD-10-CM | POA: Diagnosis not present

## 2019-12-15 DIAGNOSIS — L578 Other skin changes due to chronic exposure to nonionizing radiation: Secondary | ICD-10-CM | POA: Diagnosis not present

## 2019-12-15 DIAGNOSIS — Z85820 Personal history of malignant melanoma of skin: Secondary | ICD-10-CM | POA: Diagnosis not present

## 2019-12-23 ENCOUNTER — Ambulatory Visit
Admission: RE | Admit: 2019-12-23 | Discharge: 2019-12-23 | Disposition: A | Discharge status: Home / Self Care | Payer: Medicare PPO | Source: Ambulatory Visit | Attending: Obstetrics & Gynecology | Admitting: Obstetrics & Gynecology

## 2019-12-23 ENCOUNTER — Other Ambulatory Visit: Payer: Self-pay

## 2019-12-23 DIAGNOSIS — N6001 Solitary cyst of right breast: Secondary | ICD-10-CM

## 2019-12-23 DIAGNOSIS — N6311 Unspecified lump in the right breast, upper outer quadrant: Secondary | ICD-10-CM | POA: Diagnosis not present

## 2020-01-27 DIAGNOSIS — H2513 Age-related nuclear cataract, bilateral: Secondary | ICD-10-CM | POA: Diagnosis not present

## 2020-03-03 ENCOUNTER — Other Ambulatory Visit: Payer: Self-pay | Admitting: Family Medicine

## 2020-03-03 DIAGNOSIS — I10 Essential (primary) hypertension: Secondary | ICD-10-CM

## 2020-03-06 ENCOUNTER — Ambulatory Visit: Payer: Medicare PPO

## 2020-03-13 ENCOUNTER — Ambulatory Visit (INDEPENDENT_AMBULATORY_CARE_PROVIDER_SITE_OTHER): Payer: Medicare PPO

## 2020-03-13 VITALS — BP 126/81 | HR 65

## 2020-03-13 DIAGNOSIS — Z0000 Encounter for general adult medical examination without abnormal findings: Secondary | ICD-10-CM | POA: Diagnosis not present

## 2020-03-13 NOTE — Patient Instructions (Signed)
Amy Gonzales , Thank you for taking time to come for your Medicare Wellness Visit. I appreciate your ongoing commitment to your health goals. Please review the following plan we discussed and let me know if I can assist you in the future.   Screening recommendations/referrals: Colonoscopy: done 2017. Repeat as directed.  Mammogram: done 12/23/19 Bone Density: done 2018.  Recommended yearly ophthalmology/optometry visit for glaucoma screening and checkup Recommended yearly dental visit for hygiene and checkup  Vaccinations: Influenza vaccine: done 11/19/19 Pneumococcal vaccine: done 03/11/19 Tdap vaccine: done 2018 Shingles vaccine: Shingrix discussed. Please contact your pharmacy for coverage information.  Covid-19: done 03/26/19 & 04/27/19; please bring your vaccination record to your next appt for booster information  Advanced directives: Advance directive discussed with you today. Even though you declined this today please call our office should you change your mind and we can give you the proper paperwork for you to fill out.  Conditions/risks identified: Recommend increasing physical activity to at least 3 days per week  Next appointment: Follow up in one year for your annual wellness visit    Preventive Care 65 Years and Older, Female Preventive care refers to lifestyle choices and visits with your health care provider that can promote health and wellness. What does preventive care include?  A yearly physical exam. This is also called an annual well check.  Dental exams once or twice a year.  Routine eye exams. Ask your health care provider how often you should have your eyes checked.  Personal lifestyle choices, including:  Daily care of your teeth and gums.  Regular physical activity.  Eating a healthy diet.  Avoiding tobacco and drug use.  Limiting alcohol use.  Practicing safe sex.  Taking low-dose aspirin every day.  Taking vitamin and mineral supplements as  recommended by your health care provider. What happens during an annual well check? The services and screenings done by your health care provider during your annual well check will depend on your age, overall health, lifestyle risk factors, and family history of disease. Counseling  Your health care provider may ask you questions about your:  Alcohol use.  Tobacco use.  Drug use.  Emotional well-being.  Home and relationship well-being.  Sexual activity.  Eating habits.  History of falls.  Memory and ability to understand (cognition).  Work and work Astronomer.  Reproductive health. Screening  You may have the following tests or measurements:  Height, weight, and BMI.  Blood pressure.  Lipid and cholesterol levels. These may be checked every 5 years, or more frequently if you are over 90 years old.  Skin check.  Lung cancer screening. You may have this screening every year starting at age 42 if you have a 30-pack-year history of smoking and currently smoke or have quit within the past 15 years.  Fecal occult blood test (FOBT) of the stool. You may have this test every year starting at age 23.  Flexible sigmoidoscopy or colonoscopy. You may have a sigmoidoscopy every 5 years or a colonoscopy every 10 years starting at age 73.  Hepatitis C blood test.  Hepatitis B blood test.  Sexually transmitted disease (STD) testing.  Diabetes screening. This is done by checking your blood sugar (glucose) after you have not eaten for a while (fasting). You may have this done every 1-3 years.  Bone density scan. This is done to screen for osteoporosis. You may have this done starting at age 40.  Mammogram. This may be done every 1-2 years. Talk  to your health care provider about how often you should have regular mammograms. Talk with your health care provider about your test results, treatment options, and if necessary, the need for more tests. Vaccines  Your health care  provider may recommend certain vaccines, such as:  Influenza vaccine. This is recommended every year.  Tetanus, diphtheria, and acellular pertussis (Tdap, Td) vaccine. You may need a Td booster every 10 years.  Zoster vaccine. You may need this after age 65.  Pneumococcal 13-valent conjugate (PCV13) vaccine. One dose is recommended after age 48.  Pneumococcal polysaccharide (PPSV23) vaccine. One dose is recommended after age 39. Talk to your health care provider about which screenings and vaccines you need and how often you need them. This information is not intended to replace advice given to you by your health care provider. Make sure you discuss any questions you have with your health care provider. Document Released: 03/03/2015 Document Revised: 10/25/2015 Document Reviewed: 12/06/2014 Elsevier Interactive Patient Education  2017 [employer] Prevention in the Home Falls can cause injuries. They can happen to people of all ages. There are many things you can do to make your home safe and to help prevent falls. What can I do on the outside of my home?  Regularly fix the edges of walkways and driveways and fix any cracks.  Remove anything that might make you trip as you walk through a door, such as a raised step or threshold.  Trim any bushes or trees on the path to your home.  Use bright outdoor lighting.  Clear any walking paths of anything that might make someone trip, such as rocks or tools.  Regularly check to see if handrails are loose or broken. Make sure that both sides of any steps have handrails.  Any raised decks and porches should have guardrails on the edges.  Have any leaves, snow, or ice cleared regularly.  Use sand or salt on walking paths during winter.  Clean up any spills in your garage right away. This includes oil or grease spills. What can I do in the bathroom?  Use night lights.  Install grab bars by the toilet and in the tub and shower. Do  not use towel bars as grab bars.  Use non-skid mats or decals in the tub or shower.  If you need to sit down in the shower, use a plastic, non-slip stool.  Keep the floor dry. Clean up any water that spills on the floor as soon as it happens.  Remove soap buildup in the tub or shower regularly.  Attach bath mats securely with double-sided non-slip rug tape.  Do not have throw rugs and other things on the floor that can make you trip. What can I do in the bedroom?  Use night lights.  Make sure that you have a light by your bed that is easy to reach.  Do not use any sheets or blankets that are too big for your bed. They should not hang down onto the floor.  Have a firm chair that has side arms. You can use this for support while you get dressed.  Do not have throw rugs and other things on the floor that can make you trip. What can I do in the kitchen?  Clean up any spills right away.  Avoid walking on wet floors.  Keep items that you use a lot in easy-to-reach places.  If you need to reach something above you, use a strong step stool that  has a grab bar.  Keep electrical cords out of the way.  Do not use floor polish or wax that makes floors slippery. If you must use wax, use non-skid floor wax.  Do not have throw rugs and other things on the floor that can make you trip. What can I do with my stairs?  Do not leave any items on the stairs.  Make sure that there are handrails on both sides of the stairs and use them. Fix handrails that are broken or loose. Make sure that handrails are as long as the stairways.  Check any carpeting to make sure that it is firmly attached to the stairs. Fix any carpet that is loose or worn.  Avoid having throw rugs at the top or bottom of the stairs. If you do have throw rugs, attach them to the floor with carpet tape.  Make sure that you have a light switch at the top of the stairs and the bottom of the stairs. If you do not have them,  ask someone to add them for you. What else can I do to help prevent falls?  Wear shoes that:  Do not have high heels.  Have rubber bottoms.  Are comfortable and fit you well.  Are closed at the toe. Do not wear sandals.  If you use a stepladder:  Make sure that it is fully opened. Do not climb a closed stepladder.  Make sure that both sides of the stepladder are locked into place.  Ask someone to hold it for you, if possible.  Clearly mark and make sure that you can see:  Any grab bars or handrails.  First and last steps.  Where the edge of each step is.  Use tools that help you move around (mobility aids) if they are needed. These include:  Canes.  Walkers.  Scooters.  Crutches.  Turn on the lights when you go into a dark area. Replace any light bulbs as soon as they burn out.  Set up your furniture so you have a clear path. Avoid moving your furniture around.  If any of your floors are uneven, fix them.  If there are any pets around you, be aware of where they are.  Review your medicines with your doctor. Some medicines can make you feel dizzy. This can increase your chance of falling. Ask your doctor what other things that you can do to help prevent falls. This information is not intended to replace advice given to you by your health care provider. Make sure you discuss any questions you have with your health care provider. Document Released: 12/01/2008 Document Revised: 07/13/2015 Document Reviewed: 03/11/2014 Elsevier Interactive Patient Education  2017 [employer].

## 2020-03-13 NOTE — Progress Notes (Addendum)
Subjective:   Amy Gonzales is a 75 y.o. female who presents for an Initial Medicare Annual Wellness Visit.  Virtual Visit via Telephone Note  I connected with  Amy PhoenixLinda W Ziomek on 03/13/20 at 11:20 AM EST by telephone and verified that I am speaking with the correct person using two identifiers.  Location: Patient: home [facility] Persons participating in the virtual visit: patient/Nurse Health Advisor   I discussed the limitations, risks, security and privacy concerns of performing an evaluation and management service by telephone and the availability of in person appointments. The patient expressed understanding and agreed to proceed.  Interactive audio and video telecommunications were attempted between this nurse and patient, however failed, due to patient having technical difficulties OR patient did not have access to video capability.  We continued and completed visit with audio only.  Some vital signs may be absent or patient reported.   Reather LittlerKasey Manuela Halbur, LPN    Review of Systems     Cardiac Risk Factors include: advanced age (>3555men, 65>65 women);dyslipidemia;hypertension     Objective:    Today's Vitals   03/13/20 1138  BP: 126/81  Pulse: 65   There is no height or weight on file to calculate BMI.  Advanced Directives 03/13/2020  Does Patient Have a Medical Advance Directive? No  Would patient like information on creating a medical advance directive? No - Patient declined    Current Medications (verified) Outpatient Encounter Medications as of 03/13/2020  Medication Sig  . cholecalciferol (VITAMIN D3) 25 MCG (1000 UNIT) tablet Take 1,000 Units by mouth daily.  . Coenzyme Q10 (COQ10) 100 MG CAPS Take 1 capsule by mouth daily.  . Evening Primrose Oil 1000 MG CAPS Take 1 capsule by mouth 2 (two) times daily.  . fluticasone (FLONASE) 50 MCG/ACT nasal spray Place 1 spray into both nostrils as needed for allergies or rhinitis.  . folic acid (FOLVITE) 400 MCG tablet  Take 400 mcg by mouth daily.  . Glucos-Chondroit-Hyaluron-MSM (GLUCOSAMINE CHONDROITIN JOINT PO) Take 1 tablet by mouth 2 (two) times daily.  . hydrochlorothiazide (MICROZIDE) 12.5 MG capsule TAKE (1) CAPSULE BY MOUTH EVERY DAY  . Magnesium 250 MG TABS Take 1 tablet by mouth daily.  . Multiple Vitamins-Minerals (CENTRUM SILVER PO) Take 1 tablet by mouth daily.  . Omega-3 Fatty Acids (FISH OIL) 435 MG CAPS Take 1 capsule (435 mg total) by mouth 2 (two) times daily.  . rosuvastatin (CRESTOR) 5 MG tablet TAKE (1) TABLET BY MOUTH EVERY DAY  . [DISCONTINUED] polyethylene glycol (MIRALAX) 17 g packet Take 17 g by mouth daily as needed for moderate constipation.  . [DISCONTINUED] simethicone (GAS-X) 80 MG chewable tablet Chew 1 tablet (80 mg total) by mouth every 6 (six) hours as needed for flatulence.   No facility-administered encounter medications on file as of 03/13/2020.    Allergies (verified) Patient has no known allergies.   History: Past Medical History:  Diagnosis Date  . Hypercholesteremia   . Hypertension    Past Surgical History:  Procedure Laterality Date  . 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 LIPOMA EXCISION Right    buttock  . MELANOMA EXCISION     face  . WISDOM TOOTH EXTRACTION     Family History  Problem Relation Age of Onset  . Cancer Paternal Grandfather    Social History   Socioeconomic History  . Marital status: Married    Spouse name: Not on file  . Number of children: 0  .  Years of education: Not on file  . Highest education level: Not on file  Occupational History  . Not on file  Tobacco Use  . Smoking status: Never Smoker  . Smokeless tobacco: Never Used  Vaping Use  . Vaping Use: Never used  Substance and Sexual Activity  . Alcohol use: No  . Drug use: No  . Sexual activity: Yes  Other Topics Concern  . Not on file  Social History Narrative  . Not on file   Social Determinants of Health   Financial Resource Strain: Low Risk   .  Difficulty of Paying Living Expenses: Not hard at all  Food Insecurity: No Food Insecurity  . Worried About Programme researcher, broadcasting/film/video in the Last Year: Never true  . Ran Out of Food in the Last Year: Never true  Transportation Needs: No Transportation Needs  . Lack of Transportation (Medical): No  . Lack of Transportation (Non-Medical): No  Physical Activity: Inactive  . Days of Exercise per Week: 0 days  . Minutes of Exercise per Session: 0 min  Stress: No Stress Concern Present  . Feeling of Stress : Not at all  Social Connections: Moderately Integrated  . Frequency of Communication with Friends and Family: More than three times a week  . Frequency of Social Gatherings with Friends and Family: Three times a week  . Attends Religious Services: More than 4 times per year  . Active Member of Clubs or Organizations: No  . Attends Banker Meetings: Never  . Marital Status: Married    Tobacco Counseling Counseling given: Not Answered   Clinical Intake:  Pre-visit preparation completed: Yes  Pain : No/denies pain     Nutritional Risks: None Diabetes: No  How often do you need to have someone help you when you read instructions, pamphlets, or other written materials from your doctor or pharmacy?: 1 - Never    Interpreter Needed?: No  Information entered by :: Reather Littler LPN   Activities of Daily Living In your present state of health, do you have any difficulty performing the following activities: 03/13/2020  Hearing? N  Comment declines hearing aids  Vision? N  Difficulty concentrating or making decisions? N  Walking or climbing stairs? N  Dressing or bathing? N  Doing errands, shopping? N  Preparing Food and eating ? N  Using the Toilet? N  In the past six months, have you accidently leaked urine? N  Do you have problems with loss of bowel control? N  Managing your Medications? N  Managing your Finances? N  Housekeeping or managing your Housekeeping? N   Some recent data might be hidden    Patient Care Team: Duanne Limerick, MD as PCP - General (Family Medicine)  Indicate any recent Medical Services you may have received from other than [facility] providers in the past year (date may be approximate).     Assessment:   This is a routine wellness examination for [place].  Hearing/Vision screen  Hearing Screening   125Hz  250Hz  500Hz  1000Hz  2000Hz  3000Hz  4000Hz  6000Hz  8000Hz   Right ear:           Left ear:           Comments: Pt denies hearing difficulty  Vision Screening Comments: [facility] screenings done at [facility]  Dietary issues and exercise activities discussed: Current Exercise Habits: The patient does not participate in regular exercise at present, Exercise limited by: None identified  Goals    .  Increase physical activity     Recommend increasing physical activity to at least 3 days per week       Depression Screen PHQ 2/9 Scores 03/13/2020 09/09/2019 03/11/2019  PHQ - 2 Score 0 0 0  PHQ- 9 Score - 0 0    Fall Risk Fall Risk  03/13/2020 09/09/2019 03/11/2019  Falls in the past year? 0 0 0  Number falls in past yr: 0 - -  Injury with Fall? 0 - -  Risk for fall due to : No Fall Risks - -  Follow up Falls prevention discussed Falls evaluation completed Falls evaluation completed    FALL RISK PREVENTION PERTAINING TO THE HOME:  Any stairs in or around the home? Yes  If so, are there any without handrails? No  Home free of loose throw rugs in walkways, pet beds, electrical cords, etc? Yes  Adequate lighting in your home to reduce risk of falls? Yes   ASSISTIVE DEVICES UTILIZED TO PREVENT FALLS:  Life alert? No  Use of a cane, walker or w/c? No  Grab bars in the bathroom? No  Shower chair or bench in shower? Yes  Elevated toilet seat or a handicapped toilet? No   TIMED UP AND GO:  Was the test performed? No . Telephonic visit.   Cognitive Function: Normal cognitive status assessed by direct observation  by this Nurse Health Advisor. No abnormalities found.          Immunizations Immunization History  Administered Date(s) Administered  . Influenza, High Dose Seasonal PF 11/19/2019  . Influenza,inj,Quad PF,6+ Mos 10/20/2018  . Moderna Sars-Covid-2 Vaccination 03/26/2019, 04/27/2019  . Pneumococcal Conjugate-13 02/19/2016  . Pneumococcal Polysaccharide-23 03/11/2019  . Tdap 02/19/2016    TDAP status: Up to date  Flu Vaccine status: Up to date  Pneumococcal vaccine status: Up to date  Covid-19 vaccine status: Completed vaccines  Qualifies for Shingles Vaccine? Yes   Zostavax completed No   Shingrix Completed?: No.    Education has been provided regarding the importance of this vaccine. Patient has been advised to call insurance company to determine out of pocket expense if they have not yet received this vaccine. Advised may also receive vaccine at local pharmacy or Health Dept. Verbalized acceptance and understanding.  Screening Tests Health Maintenance  Topic Date Due  . Hepatitis C Screening  Never done  . COVID-19 Vaccine (3 - Moderna risk 4-dose series) 05/25/2019  . MAMMOGRAM  12/22/2021  . COLONOSCOPY (Pts 45-21yrs Insurance coverage will need to be confirmed)  02/18/2025  . TETANUS/TDAP  02/18/2026  . INFLUENZA VACCINE  Completed  . DEXA SCAN  Completed  . PNA vac Low Risk Adult  Completed    Health Maintenance  Health Maintenance Due  Topic Date Due  . Hepatitis C Screening  Never done  . COVID-19 Vaccine (3 - Moderna risk 4-dose series) 05/25/2019    Colorectal cancer screening: Type of screening: Colonoscopy. Completed 2017. Repeat every 10 years  Mammogram status: Completed 12/23/19. Repeat every year  Bone Density status: Completed 2018. Results reflect: Bone density results: NORMAL. Repeat every 3-5 years. Results normal per patient; she declines 2 yr interval screening.   Lung Cancer Screening: (Low Dose CT Chest recommended if Age 79-80 years, 30  pack-year currently smoking OR have quit w/in 15years.) does not qualify.   Additional Screening:  Hepatitis C Screening: does qualify; postponed  Vision Screening: Recommended annual ophthalmology exams for early detection of glaucoma and other disorders of the eye. Is the  patient up to date with their annual eye exam?  Yes  Who is the provider or what is the name of the office in which the patient attends annual eye exams? [facility]   Dental Screening: Recommended annual dental exams for proper oral hygiene  Community Resource Referral / Chronic Care Management: CRR required this visit?  No   CCM required this visit?  No      Plan:     I have personally reviewed and noted the following in the patient's chart:   . Medical and social history . Use of alcohol, tobacco or illicit drugs  . Current medications and supplements . Functional ability and status . Nutritional status . Physical activity . Advanced directives . List of other physicians . Hospitalizations, surgeries, and ER visits in previous 12 months . Vitals . Screenings to include cognitive, depression, and falls . Referrals and appointments  In addition, I have reviewed and discussed with patient certain preventive protocols, quality metrics, and best practice recommendations. A written personalized care plan for preventive services as well as general preventive health recommendations were provided to patient.     Reather Littler, LPN   [id number]   Nurse Notes: none

## 2020-04-04 ENCOUNTER — Ambulatory Visit: Payer: Medicare PPO | Admitting: Family Medicine

## 2020-04-05 ENCOUNTER — Other Ambulatory Visit: Payer: Self-pay

## 2020-04-05 ENCOUNTER — Encounter: Payer: Self-pay | Admitting: Family Medicine

## 2020-04-05 ENCOUNTER — Ambulatory Visit: Payer: Medicare PPO | Admitting: Family Medicine

## 2020-04-05 VITALS — BP 136/80 | HR 72 | Ht 65.0 in | Wt 158.0 lb

## 2020-04-05 DIAGNOSIS — E7801 Familial hypercholesterolemia: Secondary | ICD-10-CM

## 2020-04-05 DIAGNOSIS — I10 Essential (primary) hypertension: Secondary | ICD-10-CM

## 2020-04-05 MED ORDER — FLUTICASONE PROPIONATE 50 MCG/ACT NA SUSP: 1.0000 | NASAL | 1 refills | Status: DC | PRN

## 2020-04-05 MED ORDER — ROSUVASTATIN CALCIUM 5 MG PO TABS: ORAL_TABLET | ORAL | 1 refills | Status: DC

## 2020-04-05 MED ORDER — HYDROCHLOROTHIAZIDE 12.5 MG PO CAPS: ORAL_CAPSULE | ORAL | 1 refills | Status: DC

## 2020-04-05 NOTE — Progress Notes (Signed)
Date:  04/05/2020   Name:  Amy Gonzales   DOB:  10-Dec-1945   MRN:  [id number]   Chief Complaint: Hypertension, Hyperlipidemia, and Allergic Rhinitis   Hypertension This is a chronic problem. The current episode started more than 1 year ago. The problem has been gradually improving since onset. The problem is controlled. Pertinent negatives include no anxiety, blurred vision, chest pain, headaches, malaise/fatigue, neck pain, orthopnea, palpitations, peripheral edema, PND, shortness of breath or sweats. There are no associated agents to hypertension. There are no known risk factors for coronary artery disease. Past treatments include diuretics. The current treatment provides moderate improvement. There are no compliance problems.  There is no history of angina, kidney disease, CAD/MI, CVA, heart failure, left ventricular hypertrophy, PVD or retinopathy. There is no history of a hypertension causing med or renovascular disease.  Hyperlipidemia This is a chronic problem. The current episode started more than 1 year ago. The problem is controlled. Recent lipid tests were reviewed and are normal. She has no history of hypothyroidism or obesity. Pertinent negatives include no chest pain, focal sensory loss, focal weakness, leg pain, myalgias or shortness of breath. Current antihyperlipidemic treatment includes statins. The current treatment provides moderate improvement of lipids. There are no compliance problems.     Lab Results  Component Value Date   CREATININE 0.75 03/11/2019   BUN 12 03/11/2019   NA 138 03/11/2019   K 4.0 03/11/2019   CL 98 03/11/2019   CO2 24 03/11/2019   Lab Results  Component Value Date   CHOL 183 03/11/2019   HDL 85 03/11/2019   LDLCALC 78 03/11/2019   TRIG 116 03/11/2019   No results found for: TSH No results found for: HGBA1C No results found for: WBC, HGB, HCT, MCV, PLT Lab Results  Component Value Date   ALT 13 03/11/2019   AST 18 03/11/2019   ALKPHOS  57 03/11/2019   BILITOT 0.3 03/11/2019     Review of Systems  Constitutional: Negative.  Negative for chills, fatigue, fever, malaise/fatigue and unexpected weight change.  HENT: Negative for congestion, ear discharge, ear pain, rhinorrhea, sinus pressure, sneezing and sore throat.   Eyes: Negative for blurred vision, double vision, photophobia, pain, discharge, redness and itching.  Respiratory: Negative for cough, shortness of breath, wheezing and stridor.   Cardiovascular: Negative for chest pain, palpitations, orthopnea and PND.  Gastrointestinal: Negative for abdominal pain, blood in stool, constipation, diarrhea, nausea and vomiting.  Endocrine: Negative for cold intolerance, heat intolerance, polydipsia, polyphagia and polyuria.  Genitourinary: Negative for dysuria, flank pain, frequency, hematuria, menstrual problem, pelvic pain, urgency, vaginal bleeding and vaginal discharge.  Musculoskeletal: Negative for arthralgias, back pain, myalgias and neck pain.  Skin: Negative for rash.  Allergic/Immunologic: Negative for environmental allergies and food allergies.  Neurological: Negative for dizziness, focal weakness, weakness, light-headedness, numbness and headaches.  Hematological: Negative for adenopathy. Does not bruise/bleed easily.  Psychiatric/Behavioral: Negative for dysphoric mood. The patient is not nervous/anxious.     There are no problems to display for this patient.   No Known Allergies  Past Surgical History:  Procedure Laterality Date  . 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 LIPOMA EXCISION Right    buttock  . MELANOMA EXCISION     face  . WISDOM TOOTH EXTRACTION      Social History   Tobacco Use  . Smoking status: Never Smoker  . Smokeless tobacco: Never Used  Vaping Use  . Vaping Use:  Never used  Substance Use Topics  . Alcohol use: No  . Drug use: No     Medication list has been reviewed and updated.  Current Meds  Medication Sig  .  cholecalciferol (VITAMIN D3) 25 MCG (1000 UNIT) tablet Take 1,000 Units by mouth daily.  . Coenzyme Q10 (COQ10) 100 MG CAPS Take 1 capsule by mouth daily.  . Evening Primrose Oil 1000 MG CAPS Take 1 capsule by mouth 2 (two) times daily.  . fluticasone (FLONASE) 50 MCG/ACT nasal spray Place 1 spray into both nostrils as needed for allergies or rhinitis.  . folic acid (FOLVITE) 400 MCG tablet Take 400 mcg by mouth daily.  . Glucos-Chondroit-Hyaluron-MSM (GLUCOSAMINE CHONDROITIN JOINT PO) Take 1 tablet by mouth 2 (two) times daily.  . hydrochlorothiazide (MICROZIDE) 12.5 MG capsule TAKE (1) CAPSULE BY MOUTH EVERY DAY  . Magnesium 250 MG TABS Take 1 tablet by mouth daily.  . Multiple Vitamins-Minerals (CENTRUM SILVER PO) Take 1 tablet by mouth daily.  . Omega-3 Fatty Acids (FISH OIL) 435 MG CAPS Take 1 capsule (435 mg total) by mouth 2 (two) times daily.  . rosuvastatin (CRESTOR) 5 MG tablet TAKE (1) TABLET BY MOUTH EVERY DAY    PHQ 2/9 Scores 04/05/2020 03/13/2020 09/09/2019 03/11/2019  PHQ - 2 Score 0 0 0 0  PHQ- 9 Score 0 - 0 0    GAD 7 : Generalized Anxiety Score 04/05/2020 09/09/2019 03/11/2019  Nervous, Anxious, on Edge 0 0 0  Control/stop worrying 0 0 0  Worry too much - different things 0 0 0  Trouble relaxing 0 0 0  Restless 0 0 0  Easily annoyed or irritable 0 0 0  Afraid - awful might happen 0 0 0  Total GAD 7 Score 0 0 0    BP Readings from Last 3 Encounters:  04/05/20 136/80  03/13/20 126/81  09/28/19 (!) 169/105    Physical Exam Vitals and nursing note reviewed.  Constitutional:      Appearance: She is well-developed and well-nourished.  HENT:     Head: Normocephalic.     Right Ear: Tympanic membrane, ear canal and external ear normal.     Left Ear: Tympanic membrane, ear canal and external ear normal.     Nose: Nose normal. No congestion or rhinorrhea.     Mouth/Throat:     Mouth: Oropharynx is clear and moist. Mucous membranes are moist.     Pharynx: No  oropharyngeal exudate or posterior oropharyngeal erythema.  Eyes:     General: Lids are everted, no foreign bodies appreciated. No scleral icterus.       Left eye: No foreign body or hordeolum.     Extraocular Movements: EOM normal.     Conjunctiva/sclera: Conjunctivae normal.     Right eye: Right conjunctiva is not injected.     Left eye: Left conjunctiva is not injected.     Pupils: Pupils are equal, round, and reactive to light.  Neck:     Thyroid: No thyromegaly.     Vascular: No JVD.     Trachea: No tracheal deviation.  Cardiovascular:     Rate and Rhythm: Normal rate and regular rhythm.     Pulses: Intact distal pulses.     Heart sounds: Normal heart sounds. No murmur heard. No friction rub. No gallop.   Pulmonary:     Effort: Pulmonary effort is normal. No respiratory distress.     Breath sounds: Normal breath sounds. No wheezing, rhonchi or rales.  Abdominal:  General: Bowel sounds are normal.     Palpations: Abdomen is soft. There is no hepatosplenomegaly or mass.     Tenderness: There is no abdominal tenderness. There is no right CVA tenderness, left CVA tenderness, guarding or rebound.     Hernia: No hernia is present.  Musculoskeletal:        General: No tenderness or edema. Normal range of motion.     Cervical back: Normal range of motion and neck supple.  Lymphadenopathy:     Cervical: No cervical adenopathy.  Skin:    General: Skin is warm.     Capillary Refill: Capillary refill takes less than 2 seconds.     Findings: No rash.  Neurological:     Mental Status: She is alert and oriented to person, place, and time.     Cranial Nerves: No cranial nerve deficit.     Deep Tendon Reflexes: Strength normal. Reflexes normal.  Psychiatric:        Mood and Affect: Mood and affect and mood normal. Mood is not anxious or depressed.     Wt Readings from Last 3 Encounters:  04/05/20 158 lb (71.7 kg)  09/28/19 146 lb (66.2 kg)  09/09/19 148 lb (67.1 kg)    BP  136/80   Pulse 72   Ht 5\' 5"  (1.651 m)   Wt 158 lb (71.7 kg)   BMI 26.29 kg/m   Assessment and Plan: 1. Essential hypertension Chronic.  Controlled.  Stable.  Blood pressure today is 136/80.  Continue hydrochlorothiazide 12.5 mg once a day.  Will check a CMP for electrolytes and GFR. - hydrochlorothiazide (MICROZIDE) 12.5 MG capsule; TAKE (1) CAPSULE BY MOUTH EVERY DAY  Dispense: 90 capsule; Refill: 1 - Comprehensive Metabolic Panel (CMET)  2. Familial hypercholesterolemia Chronic.  Controlled.  Stable.  Continue rosuvastatin 5 mg once a day.  Will check lipid panel in a fasting condition and will adjust accordingly. - rosuvastatin (CRESTOR) 5 MG tablet; TAKE (1) TABLET BY MOUTH EVERY DAY  Dispense: 90 tablet; Refill: 1 - Lipid Panel With LDL/HDL Ratio

## 2020-04-07 DIAGNOSIS — E7801 Familial hypercholesterolemia: Secondary | ICD-10-CM | POA: Diagnosis not present

## 2020-04-07 DIAGNOSIS — I10 Essential (primary) hypertension: Secondary | ICD-10-CM | POA: Diagnosis not present

## 2020-04-08 LAB — COMPREHENSIVE METABOLIC PANEL
ALT: 19 IU/L (ref 0–32)
AST: 21 IU/L (ref 0–40)
Albumin/Globulin Ratio: 2 (ref 1.2–2.2)
Albumin: 4.3 g/dL (ref 3.7–4.7)
Alkaline Phosphatase: 53 IU/L (ref 44–121)
BUN/Creatinine Ratio: 18 (ref 12–28)
BUN: 15 mg/dL (ref 8–27)
Bilirubin Total: 0.4 mg/dL (ref 0.0–1.2)
CO2: 23 mmol/L (ref 20–29)
Calcium: 9.6 mg/dL (ref 8.7–10.3)
Chloride: 99 mmol/L (ref 96–106)
Creatinine, Ser: 0.84 mg/dL (ref 0.57–1.00)
GFR calc Af Amer: 79 mL/min/{1.73_m2} (ref >59)
GFR calc non Af Amer: 69 mL/min/{1.73_m2} (ref >59)
Globulin, Total: 2.2 g/dL (ref 1.5–4.5)
Glucose: 93 mg/dL (ref 65–99)
Potassium: 4.1 mmol/L (ref 3.5–5.2)
Sodium: 138 mmol/L (ref 134–144)
Total Protein: 6.5 g/dL (ref 6.0–8.5)

## 2020-04-08 LAB — LIPID PANEL WITH LDL/HDL RATIO
Cholesterol, Total: 194 mg/dL (ref 100–199)
HDL: 81 mg/dL (ref >39)
LDL Chol Calc (NIH): 97 mg/dL (ref 0–99)
LDL/HDL Ratio: 1.2 ratio (ref 0.0–3.2)
Triglycerides: 92 mg/dL (ref 0–149)
VLDL Cholesterol Cal: 16 mg/dL (ref 5–40)

## 2020-06-26 DIAGNOSIS — L570 Actinic keratosis: Secondary | ICD-10-CM | POA: Diagnosis not present

## 2020-06-26 DIAGNOSIS — Z85820 Personal history of malignant melanoma of skin: Secondary | ICD-10-CM | POA: Diagnosis not present

## 2020-06-26 DIAGNOSIS — Z872 Personal history of diseases of the skin and subcutaneous tissue: Secondary | ICD-10-CM | POA: Diagnosis not present

## 2020-06-26 DIAGNOSIS — L578 Other skin changes due to chronic exposure to nonionizing radiation: Secondary | ICD-10-CM | POA: Diagnosis not present

## 2020-09-09 ENCOUNTER — Other Ambulatory Visit: Payer: Self-pay

## 2020-09-09 ENCOUNTER — Ambulatory Visit
Admission: EM | Admit: 2020-09-09 | Discharge: 2020-09-09 | Disposition: A | Discharge status: Home / Self Care | Payer: Medicare PPO | Attending: Sports Medicine | Admitting: Sports Medicine

## 2020-09-09 DIAGNOSIS — R0981 Nasal congestion: Secondary | ICD-10-CM

## 2020-09-09 DIAGNOSIS — R5383 Other fatigue: Secondary | ICD-10-CM

## 2020-09-09 DIAGNOSIS — R059 Cough, unspecified: Secondary | ICD-10-CM | POA: Diagnosis not present

## 2020-09-09 DIAGNOSIS — U071 COVID-19: Secondary | ICD-10-CM | POA: Diagnosis not present

## 2020-09-09 DIAGNOSIS — J029 Acute pharyngitis, unspecified: Secondary | ICD-10-CM | POA: Diagnosis not present

## 2020-09-09 DIAGNOSIS — R519 Headache, unspecified: Secondary | ICD-10-CM

## 2020-09-09 DIAGNOSIS — B349 Viral infection, unspecified: Secondary | ICD-10-CM | POA: Diagnosis not present

## 2020-09-09 MED ORDER — PROMETHAZINE-DM 6.25-15 MG/5ML PO SYRP
5.0000 mL | ORAL_SOLUTION | Freq: Four times a day (QID) | ORAL | 0 refills | Status: DC | PRN
Start: 2020-09-09 — End: 2020-09-21

## 2020-09-09 MED ORDER — MOLNUPIRAVIR EUA 200MG CAPSULE
4.0000 | ORAL_CAPSULE | Freq: Two times a day (BID) | ORAL | 0 refills | Status: AC
Start: 2020-09-09 — End: 2020-09-14

## 2020-09-09 NOTE — ED Provider Notes (Signed)
[facility]    CSN: [id number] Arrival date & time: 09/09/20  [id number]      History   Chief Complaint Chief Complaint  Patient presents with   Headache   Sore Throat    HPI Amy Gonzales is a 75 y.o. female.   Patient is a 75 year old female who presents for evaluation of URI symptoms with potential COVID.  She did take a home test this morning she was positive.  Positive sick contacts as her husband was +3 days ago and initiated on oral antiviral medications.  She said that her symptoms began last night with a sore throat, and headache.  Progressed with nasal congestion and some fatigue, cough, myalgias, postnasal drainage.  She does have a history of seasonal allergies and takes Flonase on occasion.  No history of asthma.  No fever shakes chills.  No nausea vomiting or diarrhea.  No chest pain or shortness of breath.  No red flag signs or symptoms elicited on history.   Past Medical History:  Diagnosis Date   Hypercholesteremia    Hypertension     There are no problems to display for this patient.   Past Surgical History:  Procedure Laterality Date   fatty tumor removed     from Buttocks   LIPOMA EXCISION Right    buttock   MELANOMA EXCISION     face   WISDOM TOOTH EXTRACTION      OB History   No obstetric history on file.      Home Medications    Prior to Admission medications   Medication Sig Start Date End Date Taking? Authorizing Provider  cholecalciferol (VITAMIN D3) 25 MCG (1000 UNIT) tablet Take 1,000 Units by mouth daily.   Yes [provider]  Coenzyme Q10 (COQ10) 100 MG CAPS Take 1 capsule by mouth daily.   Yes [provider]  Evening Primrose Oil 1000 MG CAPS Take 1 capsule by mouth 2 (two) times daily.   Yes [provider]  fluticasone (FLONASE) 50 MCG/ACT nasal spray Place 1 spray into both nostrils as needed for allergies or rhinitis. 04/05/20  Yes Duanne LimerickJones, Deanna C, MD  folic acid (FOLVITE) 400 MCG tablet  Take 400 mcg by mouth daily.   Yes [provider]  Glucos-Chondroit-Hyaluron-MSM (GLUCOSAMINE CHONDROITIN JOINT PO) Take 1 tablet by mouth 2 (two) times daily.   Yes [provider]  hydrochlorothiazide (MICROZIDE) 12.5 MG capsule TAKE (1) CAPSULE BY MOUTH EVERY DAY 04/05/20  Yes Duanne LimerickJones, Deanna C, MD  Magnesium 250 MG TABS Take 1 tablet by mouth daily.   Yes [provider]  molnupiravir EUA 200 mg CAPS Take 4 capsules (800 mg total) by mouth 2 (two) times daily for 5 days. 09/09/20 09/14/20 Yes Delton SeeBarnes, Deardra Hinkley, MD  Multiple Vitamins-Minerals (CENTRUM SILVER PO) Take 1 tablet by mouth daily.   Yes [provider]  Omega-3 Fatty Acids (FISH OIL) 435 MG CAPS Take 1 capsule (435 mg total) by mouth 2 (two) times daily. 03/11/19  Yes Duanne LimerickJones, Deanna C, MD  promethazine-dextromethorphan (PROMETHAZINE-DM) 6.25-15 MG/5ML syrup Take 5 mLs by mouth 4 (four) times daily as needed for cough. 09/09/20  Yes Delton SeeBarnes, Jasminne Mealy, MD  rosuvastatin (CRESTOR) 5 MG tablet TAKE (1) TABLET BY MOUTH EVERY DAY 04/05/20  Yes Duanne LimerickJones, Deanna C, MD    Family History Family History  Problem Relation Age of Onset   Cancer Paternal Grandfather     Social History Social History   Tobacco Use   Smoking status: Never  Smokeless tobacco: Never  Vaping Use   Vaping Use: Never used  Substance Use Topics   Alcohol use: No   Drug use: No     Allergies   Patient has no known allergies.   Review of Systems Review of Systems  Constitutional:  Positive for activity change and fatigue. Negative for appetite change, chills, diaphoresis and fever.  HENT:  Positive for congestion, postnasal drip and sore throat. Negative for ear pain, rhinorrhea, sinus pressure, sinus pain and sneezing.   Eyes:  Negative for pain.  Respiratory:  Positive for cough. Negative for chest tightness, shortness of breath and wheezing.   Cardiovascular:  Negative for chest pain and palpitations.  Gastrointestinal:   Negative for abdominal pain, diarrhea, nausea and vomiting.  Genitourinary:  Negative for dysuria.  Musculoskeletal:  Positive for myalgias. Negative for back pain and neck pain.  Skin:  Negative for color change, pallor, rash and wound.  Neurological:  Positive for headaches. Negative for dizziness and light-headedness.  All other systems reviewed and are negative.   Physical Exam Triage Vital Signs ED Triage Vitals  Enc Vitals Group     BP 09/09/20 1045 136/82     Pulse Rate 09/09/20 1045 68     Resp 09/09/20 1045 18     Temp 09/09/20 1045 98.9 F (37.2 C)     Temp Source 09/09/20 1045 Oral     SpO2 09/09/20 1045 94 %     Weight 09/09/20 1043 145 lb (65.8 kg)     Height 09/09/20 1043 5\' 5"  (1.651 m)     Head Circumference --      Peak Flow --      Pain Score 09/09/20 1042 7     Pain Loc --      Pain Edu? --      Excl. in GC? --    No data found.  Updated Vital Signs BP 136/82 (BP Location: Left Arm)   Pulse 68   Temp 98.9 F (37.2 C) (Oral)   Resp 18   Ht 5\' 5"  (1.651 m)   Wt 65.8 kg   SpO2 94%   BMI 24.13 kg/m   Visual Acuity Right Eye Distance:   Left Eye Distance:   Bilateral Distance:    Right Eye Near:   Left Eye Near:    Bilateral Near:     Physical Exam Vitals and nursing note reviewed.  Constitutional:      General: She is not in acute distress.    Appearance: Normal appearance. She is not ill-appearing, toxic-appearing or diaphoretic.  HENT:     Head: Normocephalic and atraumatic.     Nose: Congestion present. No rhinorrhea.     Mouth/Throat:     Mouth: Mucous membranes are moist.     Pharynx: Uvula midline. No pharyngeal swelling, oropharyngeal exudate, posterior oropharyngeal erythema or uvula swelling.     Tonsils: No tonsillar exudate or tonsillar abscesses. 0 on the right. 0 on the left.  Eyes:     General: No scleral icterus.    Extraocular Movements: Extraocular movements intact.     Right eye: Normal extraocular motion and no  nystagmus.     Left eye: Normal extraocular motion and no nystagmus.     Conjunctiva/sclera: Conjunctivae normal.     Pupils: Pupils are equal, round, and reactive to light. Pupils are equal.  Cardiovascular:     Rate and Rhythm: Normal rate and regular rhythm.     Pulses: Normal pulses.  Heart sounds: Normal heart sounds. No murmur heard.   No friction rub. No gallop.  Pulmonary:     Effort: Pulmonary effort is normal.     Breath sounds: Normal breath sounds. No stridor. No wheezing, rhonchi or rales.  Musculoskeletal:     Cervical back: Normal range of motion and neck supple. No rigidity.  Skin:    General: Skin is warm and dry.     Capillary Refill: Capillary refill takes less than 2 seconds.     Findings: No rash.  Neurological:     General: No focal deficit present.     Mental Status: She is alert and oriented to person, place, and time.     GCS: GCS eye subscore is 4. GCS verbal subscore is 5. GCS motor subscore is 6.     Cranial Nerves: No cranial nerve deficit.     Sensory: No sensory deficit.     UC Treatments / Results  Labs (all labs ordered are listed, but only abnormal results are displayed) Labs Reviewed - No data to display  EKG   Radiology No results found.  Procedures Procedures (including critical care time)  Medications Ordered in UC Medications - No data to display  Initial Impression / Assessment and Plan / UC Course  I have reviewed the triage vital signs and the nursing notes.  Pertinent labs & imaging results that were available during my care of the patient were reviewed by me and considered in my medical decision making (see chart for details).  Clinical impression: 1.  Positive COVID-19 home test. 2.  Positive sick contacts, husband has COVID-19. 3.  Cough 4.  Headache 5.  Sore throat 6.  Nasal congestion 7.  Fatigue  Treatment plan: 1.  The findings and treatment plan were discussed in detail with the patient.  Patient was in  agreement. 2.  No need to retest her as she does have a positive home test and she is symptomatic.  We will go ahead and treat her with the oral antiviral COVID medication. 3.  Also sent in a prescription for cough medicine. 4.  Encouraged her to take her Flonase for nasal congestion. 5.  Symptomatic care, over-the-counter meds as needed, Tylenol or Motrin for any fever or discomfort.  Plenty of rest and plenty fluids. 6.  Salt water gargles and throat lozenges for her sore throat. 7.  If symptoms persist she should see her PCP. 8.  If symptoms worsen then I went over the fact that she should go to the ER for any concerns.  She voiced verbal understanding and agreed with this. 9.  She was discharged from care in stable condition and will follow-up here as needed.    Final Clinical Impressions(s) / UC Diagnoses   Final diagnoses:  COVID-19  Cough  Headache due to viral infection  Acute pharyngitis, unspecified etiology  Nasal congestion  Fatigue, unspecified type     Discharge Instructions      As we discussed, given the fact that your husband has COVID and you have a positive home test we will treat you accordingly. Sent in the oral antiviral medication to your pharmacy.  Also sent in a prescription for cough medicine.  You do have Flonase for your nasal congestion. Salt water gargles and throat lozenges for your throat. Tylenol or Motrin for any discomfort. Plenty of rest, plenty of fluids. Symptoms persist please see your primary care provider. If your symptoms worsen in any way please go to the ER.  ED Prescriptions     Medication Sig Dispense Auth. Provider   molnupiravir EUA 200 mg CAPS Take 4 capsules (800 mg total) by mouth 2 (two) times daily for 5 days. 40 capsule Delton See, MD   promethazine-dextromethorphan (PROMETHAZINE-DM) 6.25-15 MG/5ML syrup Take 5 mLs by mouth 4 (four) times daily as needed for cough. 180 mL Delton See, MD      PDMP not  reviewed this encounter.   Delton See, MD 09/09/20 1126

## 2020-09-09 NOTE — ED Triage Notes (Signed)
Pt states she took a at home test today, was positive. Pt would like to talk about getting some medication. Is having sinus pressure and headaches. No fever or body aches. Husband was tested on Wednesday and was positive.

## 2020-09-09 NOTE — Discharge Instructions (Addendum)
As we discussed, given the fact that your husband has COVID and you have a positive home test we will treat you accordingly. Sent in the oral antiviral medication to your pharmacy.  Also sent in a prescription for cough medicine.  You do have Flonase for your nasal congestion. Salt water gargles and throat lozenges for your throat. Tylenol or Motrin for any discomfort. Plenty of rest, plenty of fluids. Symptoms persist please see your primary care provider. If your symptoms worsen in any way please go to the ER.

## 2020-09-11 ENCOUNTER — Ambulatory Visit (INDEPENDENT_AMBULATORY_CARE_PROVIDER_SITE_OTHER): Payer: Medicare PPO | Admitting: Family Medicine

## 2020-09-11 ENCOUNTER — Other Ambulatory Visit: Payer: Self-pay

## 2020-09-20 ENCOUNTER — Ambulatory Visit: Payer: Medicare PPO | Admitting: Family Medicine

## 2020-09-21 ENCOUNTER — Other Ambulatory Visit: Payer: Self-pay

## 2020-09-21 ENCOUNTER — Ambulatory Visit: Payer: Medicare PPO | Admitting: Family Medicine

## 2020-09-21 ENCOUNTER — Encounter: Payer: Self-pay | Admitting: Family Medicine

## 2020-09-21 VITALS — BP 130/64 | HR 76 | Ht 65.0 in | Wt 156.0 lb

## 2020-09-21 DIAGNOSIS — J301 Allergic rhinitis due to pollen: Secondary | ICD-10-CM | POA: Diagnosis not present

## 2020-09-21 DIAGNOSIS — I10 Essential (primary) hypertension: Secondary | ICD-10-CM | POA: Diagnosis not present

## 2020-09-21 DIAGNOSIS — E7801 Familial hypercholesterolemia: Secondary | ICD-10-CM | POA: Diagnosis not present

## 2020-09-21 MED ORDER — FLUTICASONE PROPIONATE 50 MCG/ACT NA SUSP: 1.0000 | NASAL | 1 refills | Status: DC | PRN

## 2020-09-21 MED ORDER — ROSUVASTATIN CALCIUM 5 MG PO TABS: ORAL_TABLET | ORAL | 1 refills | Status: DC

## 2020-09-21 MED ORDER — HYDROCHLOROTHIAZIDE 12.5 MG PO CAPS: ORAL_CAPSULE | ORAL | 1 refills | Status: DC

## 2020-09-21 NOTE — Progress Notes (Signed)
Date:  09/21/2020   Name:  Amy Gonzales   DOB:  February 07, 1946   MRN:  [id number]   Chief Complaint: Hypertension, Hyperlipidemia, and Allergic Rhinitis   Hypertension This is a chronic problem. The current episode started more than 1 year ago. The problem has been gradually improving since onset. The problem is controlled. Pertinent negatives include no anxiety, blurred vision, chest pain, headaches, malaise/fatigue, neck pain, orthopnea, palpitations, peripheral edema, PND, shortness of breath or sweats. There are no associated agents to hypertension. Past treatments include diuretics. The current treatment provides moderate improvement. There are no compliance problems.  There is no history of angina, kidney disease, CAD/MI, CVA, heart failure, left ventricular hypertrophy, PVD or retinopathy.  Hyperlipidemia This is a chronic problem. The current episode started more than 1 year ago. The problem is controlled. Recent lipid tests were reviewed and are normal. Pertinent negatives include no chest pain, leg pain, myalgias or shortness of breath. Current antihyperlipidemic treatment includes statins. The current treatment provides moderate improvement of lipids. There are no compliance problems.  Risk factors for coronary artery disease include dyslipidemia.   Lab Results  Component Value Date   CREATININE 0.84 04/07/2020   BUN 15 04/07/2020   NA 138 04/07/2020   K 4.1 04/07/2020   CL 99 04/07/2020   CO2 23 04/07/2020   Lab Results  Component Value Date   CHOL 194 04/07/2020   HDL 81 04/07/2020   LDLCALC 97 04/07/2020   TRIG 92 04/07/2020   No results found for: TSH No results found for: HGBA1C No results found for: WBC, HGB, HCT, MCV, PLT Lab Results  Component Value Date   ALT 19 04/07/2020   AST 21 04/07/2020   ALKPHOS 53 04/07/2020   BILITOT 0.4 04/07/2020     Review of Systems  Constitutional:  Negative for chills, fever and malaise/fatigue.  HENT:  Negative for  drooling, ear discharge, ear pain and sore throat.   Eyes:  Negative for blurred vision.  Respiratory:  Negative for cough, shortness of breath and wheezing.   Cardiovascular:  Negative for chest pain, palpitations, orthopnea, leg swelling and PND.  Gastrointestinal:  Negative for abdominal pain, blood in stool, constipation, diarrhea and nausea.  Endocrine: Negative for polydipsia.  Genitourinary:  Negative for dysuria, frequency, hematuria and urgency.  Musculoskeletal:  Negative for back pain, myalgias and neck pain.  Skin:  Negative for rash.  Allergic/Immunologic: Negative for environmental allergies.  Neurological:  Negative for dizziness and headaches.  Hematological:  Does not bruise/bleed easily.  Psychiatric/Behavioral:  Negative for suicidal ideas. The patient is not nervous/anxious.    There are no problems to display for this patient.   No Known Allergies  Past Surgical History:  Procedure Laterality Date   fatty tumor removed     from Buttocks   LIPOMA EXCISION Right    buttock   MELANOMA EXCISION     face   WISDOM TOOTH EXTRACTION      Social History   Tobacco Use   Smoking status: Never   Smokeless tobacco: Never  Vaping Use   Vaping Use: Never used  Substance Use Topics   Alcohol use: No   Drug use: No     Medication list has been reviewed and updated.  Current Meds  Medication Sig   cholecalciferol (VITAMIN D3) 25 MCG (1000 UNIT) tablet Take 1,000 Units by mouth daily.   Coenzyme Q10 (COQ10) 100 MG CAPS Take 1 capsule by mouth daily.   Evening  Primrose Oil 1000 MG CAPS Take 1 capsule by mouth 2 (two) times daily.   fluticasone (FLONASE) 50 MCG/ACT nasal spray Place 1 spray into both nostrils as needed for allergies or rhinitis.   folic acid (FOLVITE) 400 MCG tablet Take 400 mcg by mouth daily.   Glucos-Chondroit-Hyaluron-MSM (GLUCOSAMINE CHONDROITIN JOINT PO) Take 1 tablet by mouth 2 (two) times daily.   hydrochlorothiazide (MICROZIDE) 12.5 MG  capsule TAKE (1) CAPSULE BY MOUTH EVERY DAY   Magnesium 250 MG TABS Take 1 tablet by mouth daily.   Multiple Vitamins-Minerals (CENTRUM SILVER PO) Take 1 tablet by mouth daily.   Omega-3 Fatty Acids (FISH OIL) 435 MG CAPS Take 1 capsule (435 mg total) by mouth 2 (two) times daily.   rosuvastatin (CRESTOR) 5 MG tablet TAKE (1) TABLET BY MOUTH EVERY DAY    PHQ 2/9 Scores 09/21/2020 04/05/2020 03/13/2020 09/09/2019  PHQ - 2 Score 0 0 0 0  PHQ- 9 Score 0 0 - 0    GAD 7 : Generalized Anxiety Score 09/21/2020 04/05/2020 09/09/2019 03/11/2019  Nervous, Anxious, on Edge 0 0 0 0  Control/stop worrying 0 0 0 0  Worry too much - different things 0 0 0 0  Trouble relaxing 0 0 0 0  Restless 0 0 0 0  Easily annoyed or irritable 0 0 0 0  Afraid - awful might happen 0 0 0 0  Total GAD 7 Score 0 0 0 0    BP Readings from Last 3 Encounters:  09/09/20 136/82  04/05/20 136/80  03/13/20 126/81    Physical Exam Vitals and nursing note reviewed.  Constitutional:      General: She is not in acute distress.    Appearance: She is not diaphoretic.  HENT:     Head: Normocephalic and atraumatic.     Right Ear: Tympanic membrane, ear canal and external ear normal.     Left Ear: Tympanic membrane, ear canal and external ear normal.     Nose: Nose normal. No congestion or rhinorrhea.  Eyes:     General:        Right eye: No discharge.        Left eye: No discharge.     Conjunctiva/sclera: Conjunctivae normal.     Pupils: Pupils are equal, round, and reactive to light.  Neck:     Thyroid: No thyromegaly.     Vascular: No JVD.  Cardiovascular:     Rate and Rhythm: Normal rate and regular rhythm.     Heart sounds: Normal heart sounds. No murmur heard.   No friction rub. No gallop.  Pulmonary:     Effort: Pulmonary effort is normal.     Breath sounds: Normal breath sounds. No wheezing or rhonchi.  Abdominal:     General: Bowel sounds are normal.     Palpations: Abdomen is soft. There is no mass.      Tenderness: There is no abdominal tenderness. There is no guarding.  Musculoskeletal:        General: Normal range of motion.     Cervical back: Normal range of motion and neck supple.  Lymphadenopathy:     Cervical: No cervical adenopathy.  Skin:    General: Skin is warm and dry.  Neurological:     Mental Status: She is alert.     Deep Tendon Reflexes: Reflexes are normal and symmetric.    Wt Readings from Last 3 Encounters:  09/21/20 156 lb (70.8 kg)  09/09/20 145 lb (65.8 kg)  04/05/20 158 lb (71.7 kg)    Ht 5\' 5"  (1.651 m)   Wt 156 lb (70.8 kg)   BMI 25.96 kg/m   Assessment and Plan:  1. Essential hypertension Chronic.  Controlled.  Stable.  Blood pressure today is controlled at 130/64.  We will continue hydrochlorothiazide 12.5 mg once a day. - hydrochlorothiazide (MICROZIDE) 12.5 MG capsule; TAKE (1) CAPSULE BY MOUTH EVERY DAY  Dispense: 90 capsule; Refill: 1  2. Familial hypercholesterolemia Chronic.  Controlled.  Stable.  Continue rosuvastatin 5 mg once a day.  We will recheck lipid control in 6 months. - rosuvastatin (CRESTOR) 5 MG tablet; TAKE (1) TABLET BY MOUTH EVERY DAY  Dispense: 90 tablet; Refill: 1  3. Seasonal allergic rhinitis due to pollen Chronic.  Controlled.  Stable.  Patient does have an episodic pending pollen and other concerns.  We will proceed to prescribe the fluticasone nasal spray 1 spray in both nostrils as needed for allergies or rhinitis. - fluticasone (FLONASE) 50 MCG/ACT nasal spray; Place 1 spray into both nostrils as needed for allergies or rhinitis.  Dispense: 9.9 mL; Refill: 1

## 2020-12-08 DIAGNOSIS — Z1211 Encounter for screening for malignant neoplasm of colon: Secondary | ICD-10-CM | POA: Diagnosis not present

## 2020-12-08 DIAGNOSIS — N814 Uterovaginal prolapse, unspecified: Secondary | ICD-10-CM | POA: Diagnosis not present

## 2020-12-08 DIAGNOSIS — Z124 Encounter for screening for malignant neoplasm of cervix: Secondary | ICD-10-CM | POA: Diagnosis not present

## 2020-12-08 DIAGNOSIS — Z1231 Encounter for screening mammogram for malignant neoplasm of breast: Secondary | ICD-10-CM | POA: Diagnosis not present

## 2020-12-08 DIAGNOSIS — N63 Unspecified lump in unspecified breast: Secondary | ICD-10-CM | POA: Diagnosis not present

## 2020-12-11 ENCOUNTER — Encounter: Payer: Self-pay | Admitting: Family Medicine

## 2020-12-11 ENCOUNTER — Ambulatory Visit: Payer: Medicare PPO | Admitting: Family Medicine

## 2020-12-11 ENCOUNTER — Other Ambulatory Visit: Payer: Self-pay

## 2020-12-11 VITALS — BP 138/70 | HR 68 | Temp 98.0°F | Ht 65.0 in | Wt 158.0 lb

## 2020-12-11 DIAGNOSIS — J0100 Acute maxillary sinusitis, unspecified: Secondary | ICD-10-CM

## 2020-12-11 MED ORDER — FLUCONAZOLE 150 MG PO TABS: 150.0000 mg | ORAL_TABLET | Freq: Once | ORAL | 0 refills | Status: AC

## 2020-12-11 MED ORDER — AZITHROMYCIN 250 MG PO TABS: ORAL_TABLET | ORAL | 0 refills | Status: AC

## 2020-12-11 NOTE — Progress Notes (Signed)
Date:  12/11/2020   Name:  Amy Gonzales   DOB:  12/25/1945   MRN:  [id number]   Chief Complaint: Sinusitis (Some color production. Got up leaves last week- started Friday night with stuffiness and head congestion)  Sinusitis This is a new problem. The current episode started in the past 7 days. The problem has been gradually worsening since onset. There has been no fever. Her pain is at a severity of 2/10. The pain is mild. Associated symptoms include congestion, coughing, a hoarse voice and a sore throat. Pertinent negatives include no chills, diaphoresis, ear pain, headaches, neck pain, shortness of breath, sinus pressure, sneezing or swollen glands. Past treatments include oral decongestants and spray decongestants. The treatment provided no relief.   Lab Results  Component Value Date   CREATININE 0.84 04/07/2020   BUN 15 04/07/2020   NA 138 04/07/2020   K 4.1 04/07/2020   CL 99 04/07/2020   CO2 23 04/07/2020   Lab Results  Component Value Date   CHOL 194 04/07/2020   HDL 81 04/07/2020   LDLCALC 97 04/07/2020   TRIG 92 04/07/2020   No results found for: TSH No results found for: HGBA1C No results found for: WBC, HGB, HCT, MCV, PLT Lab Results  Component Value Date   ALT 19 04/07/2020   AST 21 04/07/2020   ALKPHOS 53 04/07/2020   BILITOT 0.4 04/07/2020     Review of Systems  Constitutional:  Negative for chills, diaphoresis and fever.  HENT:  Positive for congestion, hoarse voice and sore throat. Negative for drooling, ear discharge, ear pain, sinus pressure and sneezing.   Respiratory:  Positive for cough. Negative for shortness of breath and wheezing.   Cardiovascular:  Negative for chest pain, palpitations and leg swelling.  Gastrointestinal:  Negative for abdominal pain, blood in stool, constipation, diarrhea and nausea.  Endocrine: Negative for polydipsia.  Genitourinary:  Negative for dysuria, frequency, hematuria and urgency.  Musculoskeletal:  Negative  for back pain, myalgias and neck pain.  Skin:  Negative for rash.  Allergic/Immunologic: Negative for environmental allergies.  Neurological:  Negative for dizziness and headaches.  Hematological:  Does not bruise/bleed easily.  Psychiatric/Behavioral:  Negative for suicidal ideas. The patient is not nervous/anxious.    There are no problems to display for this patient.   No Known Allergies  Past Surgical History:  Procedure Laterality Date   fatty tumor removed     from Buttocks   LIPOMA EXCISION Right    buttock   MELANOMA EXCISION     face   WISDOM TOOTH EXTRACTION      Social History   Tobacco Use   Smoking status: Never   Smokeless tobacco: Never  Vaping Use   Vaping Use: Never used  Substance Use Topics   Alcohol use: No   Drug use: No     Medication list has been reviewed and updated.  No outpatient medications have been marked as taking for the 12/11/20 encounter (Office Visit) with Duanne Limerick, MD.    [facility] 2/9 Scores 09/21/2020 04/05/2020 03/13/2020 09/09/2019  PHQ - 2 Score 0 0 0 0  PHQ- 9 Score 0 0 - 0    GAD 7 : Generalized Anxiety Score 09/21/2020 04/05/2020 09/09/2019 03/11/2019  Nervous, Anxious, on Edge 0 0 0 0  Control/stop worrying 0 0 0 0  Worry too much - different things 0 0 0 0  Trouble relaxing 0 0 0 0  Restless 0 0 0 0  Easily annoyed or irritable 0 0 0 0  Afraid - awful might happen 0 0 0 0  Total GAD 7 Score 0 0 0 0    BP Readings from Last 3 Encounters:  09/21/20 130/64  09/09/20 136/82  04/05/20 136/80    Physical Exam Vitals and nursing note reviewed.  Constitutional:      General: She is not in acute distress.    Appearance: She is not diaphoretic.  HENT:     Head: Normocephalic and atraumatic.     Right Ear: Tympanic membrane, ear canal and external ear normal.     Left Ear: Tympanic membrane, ear canal and external ear normal.     Nose: Nose normal. No congestion or rhinorrhea.  Eyes:     General:        Right eye: No  discharge.        Left eye: No discharge.     Conjunctiva/sclera: Conjunctivae normal.     Pupils: Pupils are equal, round, and reactive to light.  Neck:     Thyroid: No thyromegaly.     Vascular: No JVD.  Cardiovascular:     Rate and Rhythm: Normal rate and regular rhythm.     Heart sounds: Normal heart sounds. No murmur heard.   No friction rub. No gallop.  Pulmonary:     Effort: Pulmonary effort is normal. No respiratory distress.     Breath sounds: Normal breath sounds. No stridor. No wheezing or rhonchi.  Abdominal:     General: Bowel sounds are normal.     Palpations: Abdomen is soft. There is no mass.     Tenderness: There is no abdominal tenderness. There is no guarding.  Musculoskeletal:        General: Normal range of motion.     Cervical back: Normal range of motion and neck supple.  Lymphadenopathy:     Cervical: No cervical adenopathy.  Skin:    General: Skin is warm and dry.  Neurological:     Mental Status: She is alert.    Wt Readings from Last 3 Encounters:  12/11/20 158 lb (71.7 kg)  09/21/20 156 lb (70.8 kg)  09/09/20 145 lb (65.8 kg)    Ht 5\' 5"  (1.651 m)   Wt 158 lb (71.7 kg)   BMI 26.29 kg/m   Assessment and Plan:  1. Acute non-recurrent maxillary sinusitis New onset.  Persistent.  Patient came down over the past couple days with upper respiratory congestion and tenderness over the maxillary sinuses and discomfort in the cheek areas bilateral.  Exam and history is consistent with an maxillary sinusitis and we will treat with azithromycin to 50 mg 2 today followed by 1 a day for 4 days.  Patient does have an upcoming football game and we will give Diflucan 150 mg to have given the possibility of a yeast infection. - azithromycin (ZITHROMAX) 250 MG tablet; Take 2 tablets on day 1, then 1 tablet daily on days 2 through 5  Dispense: 6 tablet; Refill: 0 - fluconazole (DIFLUCAN) 150 MG tablet; Take 1 tablet (150 mg total) by mouth once for 1 dose.   Dispense: 1 tablet; Refill: 0

## 2020-12-25 ENCOUNTER — Other Ambulatory Visit: Payer: Self-pay | Admitting: Obstetrics and Gynecology

## 2020-12-25 DIAGNOSIS — Z1231 Encounter for screening mammogram for malignant neoplasm of breast: Secondary | ICD-10-CM

## 2020-12-26 DIAGNOSIS — D485 Neoplasm of uncertain behavior of skin: Secondary | ICD-10-CM | POA: Diagnosis not present

## 2020-12-26 DIAGNOSIS — Z85820 Personal history of malignant melanoma of skin: Secondary | ICD-10-CM | POA: Diagnosis not present

## 2020-12-26 DIAGNOSIS — L820 Inflamed seborrheic keratosis: Secondary | ICD-10-CM | POA: Diagnosis not present

## 2020-12-26 DIAGNOSIS — Z872 Personal history of diseases of the skin and subcutaneous tissue: Secondary | ICD-10-CM | POA: Diagnosis not present

## 2020-12-29 ENCOUNTER — Ambulatory Visit
Admission: RE | Admit: 2020-12-29 | Discharge: 2020-12-29 | Disposition: A | Discharge status: Home / Self Care | Payer: Medicare PPO | Source: Ambulatory Visit | Attending: Obstetrics and Gynecology | Admitting: Obstetrics and Gynecology

## 2020-12-29 ENCOUNTER — Other Ambulatory Visit: Payer: Self-pay

## 2020-12-29 DIAGNOSIS — Z1231 Encounter for screening mammogram for malignant neoplasm of breast: Secondary | ICD-10-CM | POA: Insufficient documentation

## 2021-01-01 DIAGNOSIS — H2513 Age-related nuclear cataract, bilateral: Secondary | ICD-10-CM | POA: Diagnosis not present

## 2021-01-16 DIAGNOSIS — Z1211 Encounter for screening for malignant neoplasm of colon: Secondary | ICD-10-CM | POA: Diagnosis not present

## 2021-02-05 ENCOUNTER — Other Ambulatory Visit: Payer: Self-pay | Admitting: Family Medicine

## 2021-02-05 DIAGNOSIS — I10 Essential (primary) hypertension: Secondary | ICD-10-CM

## 2021-02-06 NOTE — Telephone Encounter (Signed)
Requested Prescriptions  Refused Prescriptions Disp Refills   hydrochlorothiazide (MICROZIDE) 12.5 MG capsule [Pharmacy Med Name: HYDROCHLOROTHIAZIDE 12.5 MG CAP] 90 capsule 1    Sig: TAKE (1) CAPSULE BY MOUTH EVERY DAY     Cardiovascular: Diuretics - Thiazide Passed - 02/05/2021 12:43 PM      Passed - Ca in normal range and within 360 days    Calcium  Date Value Ref Range Status  04/07/2020 9.6 8.7 - 10.3 mg/dL Final         Passed - Cr in normal range and within 360 days    Creatinine, Ser  Date Value Ref Range Status  04/07/2020 0.84 0.57 - 1.00 mg/dL Final         Passed - K in normal range and within 360 days    Potassium  Date Value Ref Range Status  04/07/2020 4.1 3.5 - 5.2 mmol/L Final         Passed - Na in normal range and within 360 days    Sodium  Date Value Ref Range Status  04/07/2020 138 134 - 144 mmol/L Final         Passed - Last BP in normal range    BP Readings from Last 1 Encounters:  12/11/20 138/70         Passed - Valid encounter within last 6 months    Recent Outpatient Visits          1 month ago Acute non-recurrent maxillary sinusitis   [facility] Duanne Limerick, MD   4 months ago Essential hypertension   [facility] Duanne Limerick, MD   4 months ago Erroneous encounter - disregard   [facility] Duanne Limerick, MD   10 months ago Essential hypertension   [facility] Duanne Limerick, MD   1 year ago Acute non-recurrent maxillary sinusitis   [facility] Duanne Limerick, MD      Future Appointments            In 1 month Duanne Limerick, MD [facility], [facility]

## 2021-02-08 ENCOUNTER — Other Ambulatory Visit: Payer: Self-pay | Admitting: Family Medicine

## 2021-02-08 DIAGNOSIS — I10 Essential (primary) hypertension: Secondary | ICD-10-CM

## 2021-02-08 NOTE — Telephone Encounter (Signed)
Call to pharmacy- they state the patient last picked up 12/05/20 #90. Patient will be out of Rx before next appointment 03/14/21- Rx extension sent Requested Prescriptions  Pending Prescriptions Disp Refills   hydrochlorothiazide (MICROZIDE) 12.5 MG capsule [Pharmacy Med Name: HYDROCHLOROTHIAZIDE 12.5 MG CAP] 90 capsule 0    Sig: TAKE (1) CAPSULE BY MOUTH EVERY DAY     Cardiovascular: Diuretics - Thiazide Passed - 02/08/2021 12:47 PM      Passed - Ca in normal range and within 360 days    Calcium  Date Value Ref Range Status  04/07/2020 9.6 8.7 - 10.3 mg/dL Final         Passed - Cr in normal range and within 360 days    Creatinine, Ser  Date Value Ref Range Status  04/07/2020 0.84 0.57 - 1.00 mg/dL Final         Passed - K in normal range and within 360 days    Potassium  Date Value Ref Range Status  04/07/2020 4.1 3.5 - 5.2 mmol/L Final         Passed - Na in normal range and within 360 days    Sodium  Date Value Ref Range Status  04/07/2020 138 134 - 144 mmol/L Final         Passed - Last BP in normal range    BP Readings from Last 1 Encounters:  12/11/20 138/70         Passed - Valid encounter within last 6 months    Recent Outpatient Visits          1 month ago Acute non-recurrent maxillary sinusitis   [facility] Duanne Limerick, MD   4 months ago Essential hypertension   [facility] Duanne Limerick, MD   5 months ago Erroneous encounter - disregard   [facility] Duanne Limerick, MD   10 months ago Essential hypertension   [facility] Duanne Limerick, MD   1 year ago Acute non-recurrent maxillary sinusitis   [facility] Duanne Limerick, MD      Future Appointments            In 1 month Duanne Limerick, MD [facility], [facility]

## 2021-02-20 ENCOUNTER — Other Ambulatory Visit: Payer: Self-pay

## 2021-02-20 ENCOUNTER — Encounter: Payer: Self-pay | Admitting: Family Medicine

## 2021-02-20 ENCOUNTER — Ambulatory Visit: Payer: Medicare PPO | Admitting: Family Medicine

## 2021-02-20 VITALS — BP 146/100 | HR 76 | Temp 98.2°F | Ht 65.0 in | Wt 159.0 lb

## 2021-02-20 DIAGNOSIS — J0100 Acute maxillary sinusitis, unspecified: Secondary | ICD-10-CM | POA: Diagnosis not present

## 2021-02-20 DIAGNOSIS — R051 Acute cough: Secondary | ICD-10-CM | POA: Diagnosis not present

## 2021-02-20 MED ORDER — GUAIFENESIN-CODEINE 100-10 MG/5ML PO SYRP: 5.0000 mL | ORAL_SOLUTION | Freq: Three times a day (TID) | ORAL | 0 refills | Status: DC | PRN

## 2021-02-20 MED ORDER — AMOXICILLIN-POT CLAVULANATE 875-125 MG PO TABS: 1.0000 | ORAL_TABLET | Freq: Two times a day (BID) | ORAL | 0 refills | Status: DC

## 2021-02-20 NOTE — Progress Notes (Signed)
Date:  02/20/2021   Name:  Amy Gonzales   DOB:  [birth year]   MRN:  [id number]   Chief Complaint: Sinusitis (No fever, drainage, no SOB, COVID negative- taking sudafed and Claritin)  Sinusitis This is a new problem. The current episode started in the past 7 days. The problem has been waxing and waning since onset. There has been no fever. The pain is moderate. Associated symptoms include congestion, coughing and sinus pressure. Pertinent negatives include no chills, diaphoresis, ear pain, headaches, hoarse voice, neck pain, shortness of breath, sneezing, sore throat or swollen glands.   Lab Results  Component Value Date   NA 138 04/07/2020   K 4.1 04/07/2020   CO2 23 04/07/2020   GLUCOSE 93 04/07/2020   BUN 15 04/07/2020   CREATININE 0.84 04/07/2020   CALCIUM 9.6 04/07/2020   GFRNONAA 69 04/07/2020   Lab Results  Component Value Date   CHOL 194 04/07/2020   HDL 81 04/07/2020   LDLCALC 97 04/07/2020   TRIG 92 04/07/2020   No results found for: TSH No results found for: HGBA1C No results found for: WBC, HGB, HCT, MCV, PLT Lab Results  Component Value Date   ALT 19 04/07/2020   AST 21 04/07/2020   ALKPHOS 53 04/07/2020   BILITOT 0.4 04/07/2020   No results found for: 25OHVITD2, 25OHVITD3, VD25OH   Review of Systems  Constitutional:  Negative for chills and diaphoresis.  HENT:  Positive for congestion and sinus pressure. Negative for ear pain, hoarse voice, sneezing and sore throat.   Respiratory:  Positive for cough. Negative for shortness of breath and wheezing.   Cardiovascular:  Negative for chest pain.  Musculoskeletal:  Negative for neck pain.  Neurological:  Negative for headaches.   There are no problems to display for this patient.   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  Current Meds  Medication Sig   cholecalciferol (VITAMIN D3) 25 MCG (1000 UNIT) tablet Take 1,000 Units by mouth daily.   Coenzyme Q10 (COQ10) 100 MG CAPS Take 1 capsule by mouth daily.   Evening Primrose Oil 1000 MG CAPS Take 1 capsule by mouth 2 (two) times daily.   fluticasone (FLONASE) 50 MCG/ACT nasal spray Place 1 spray into both nostrils as needed for allergies or rhinitis.   folic acid (FOLVITE) 400 MCG tablet Take 400 mcg by mouth daily.   Glucos-Chondroit-Hyaluron-MSM (GLUCOSAMINE CHONDROITIN JOINT PO) Take 1 tablet by mouth 2 (two) times daily.   hydrochlorothiazide (MICROZIDE) 12.5 MG capsule TAKE (1) CAPSULE BY MOUTH EVERY DAY   Magnesium 250 MG TABS Take 1 tablet by mouth daily.   Multiple Vitamins-Minerals (CENTRUM SILVER PO) Take 1 tablet by mouth daily.   Omega-3 Fatty Acids (FISH OIL) 435 MG CAPS Take 1 capsule (435 mg total) by mouth 2 (two) times daily.   rosuvastatin (CRESTOR) 5 MG tablet TAKE (1) TABLET BY MOUTH EVERY DAY    PHQ 2/9 Scores 09/21/2020  04/05/2020 03/13/2020 09/09/2019  PHQ - 2 Score 0 0 0 0  PHQ- 9 Score 0 0 - 0    GAD 7 : Generalized Anxiety Score 09/21/2020 04/05/2020 09/09/2019 03/11/2019  Nervous, Anxious, on Edge 0 0 0 0  Control/stop worrying 0 0 0 0  Worry too much - different things 0 0 0 0  Trouble relaxing 0 0 0 0  Restless 0 0 0 0  Easily annoyed or irritable 0 0 0 0  Afraid - awful might happen 0 0 0 0  Total GAD 7 Score 0 0 0 0    BP Readings from Last 3 Encounters:  02/20/21 (!) 146/100  12/11/20 138/70  09/21/20 130/64    Physical Exam Vitals and nursing note reviewed.  Constitutional:      Appearance: She is well-developed.  HENT:     Head: Normocephalic.     Right Ear: Tympanic membrane and external ear  normal.     Left Ear: Tympanic membrane and external ear normal.     Nose: Nose normal.  Eyes:     General: Lids are everted, no foreign bodies appreciated. No scleral icterus.       Left eye: No foreign body or hordeolum.     Conjunctiva/sclera: Conjunctivae normal.     Right eye: Right conjunctiva is not injected.     Left eye: Left conjunctiva is not injected.     Pupils: Pupils are equal, round, and reactive to light.  Neck:     Thyroid: No thyromegaly.     Vascular: No JVD.     Trachea: No tracheal deviation.  Cardiovascular:     Rate and Rhythm: Normal rate and regular rhythm.     Heart sounds: Normal heart sounds. No murmur heard.   No friction rub. No gallop.  Pulmonary:     Effort: Pulmonary effort is normal. No respiratory distress.     Breath sounds: Normal breath sounds. No wheezing, rhonchi or rales.  Abdominal:     General: Bowel sounds are normal.     Palpations: Abdomen is soft. There is no mass.     Tenderness: There is no abdominal tenderness. There is no guarding or rebound.  Musculoskeletal:        General: No tenderness. Normal range of motion.     Cervical back: Normal range of motion and neck supple.  Lymphadenopathy:     Cervical: No cervical adenopathy.  Skin:    General: Skin is warm.     Findings: No bruising, erythema or rash.  Neurological:     Mental Status: She is alert and oriented to person, place, and time.     Cranial Nerves: No cranial nerve deficit.     Deep Tendon Reflexes: Reflexes normal.  Psychiatric:        Mood and Affect: Mood is not anxious or depressed.    Wt Readings from Last 3 Encounters:  02/20/21 159 lb (72.1 kg)  12/11/20 158 lb (71.7 kg)  09/21/20 156 lb (70.8 kg)    BP (!) 146/100    Pulse 76    Temp 98.2 F (36.8 C)    Ht 5\' 5"  (1.651 m)    Wt 159 lb (72.1 kg)    SpO2 99%    BMI 26.46 kg/m   Assessment and Plan:  1. Acute non-recurrent maxillary sinusitis New onset.  Persistent.  Gradually worsening.  Patient  has history and examination consistent with maxillary sinusitis more so than the right.  We will  treat with Augmentin 875 mg 1 twice a day. - amoxicillin-clavulanate (AUGMENTIN) 875-125 MG tablet; Take 1 tablet by mouth 2 (two) times daily.  Dispense: 20 tablet; Refill: 0  2. Acute cough New onset.  Persistent.  At this has become an issue with sleep and patient has had a cough that is been bothersome particularly over the past 48 hours.  We will treat with Robitussin-AC 1 teaspoon every 8 hours as needed for persistent cough. - guaiFENesin-codeine (ROBITUSSIN AC) 100-10 MG/5ML syrup; Take 5 mLs by mouth 3 (three) times daily as needed for cough.  Dispense: 118 mL; Refill: 0

## 2021-02-21 ENCOUNTER — Encounter: Payer: Self-pay | Admitting: Family Medicine

## 2021-02-21 ENCOUNTER — Ambulatory Visit: Payer: Self-pay | Admitting: *Deleted

## 2021-02-21 ENCOUNTER — Other Ambulatory Visit: Payer: Self-pay

## 2021-02-21 DIAGNOSIS — J0100 Acute maxillary sinusitis, unspecified: Secondary | ICD-10-CM

## 2021-02-21 MED ORDER — AMOXICILLIN 500 MG PO CAPS: 500.0000 mg | ORAL_CAPSULE | Freq: Three times a day (TID) | ORAL | 0 refills | Status: AC

## 2021-02-21 NOTE — Telephone Encounter (Signed)
Patient was prescribed amoxicillin-clavulanate (AUGMENTIN) 875-125 MG tablet, caller states medication is making her vomit   Chief Complaint: Vomiting Symptoms: Several episodes of vomiting after taking ATB Frequency: last night , 5 hours after taking ATB Pertinent Negatives: Patient denies abdominal pain, no further vomiting since last night Disposition: [] ED /[] Urgent Care (no appt availability in office) / [] Appointment(In office/virtual)/ []  [facility]/ [] Home Care/ [] Refused Recommended Disposition /[] [facility]/ []  Follow-up with PCP Additional Notes:  Pt took first dose of ATB at 4pm last night, vomited several times at 9pm. Has not taken additional doses. Requesting alternate ATB. "I think it is too strong for me."States she did eat prior to taking ATB.          Reason for Disposition  Caller wants to use a complementary or alternative medicine  Answer Assessment - Initial Assessment Questions 1. NAME of MEDICATION: "What medicine are you calling about?"     Amoxicillin 2. QUESTION: "What is your question?" (e.g., double dose of medicine, side effect)     Can it be changed 3. PRESCRIBING HCP: "Who prescribed it?" Reason: if prescribed by specialist, call should be referred to that group.     Dr.Jones 4. SYMPTOMS: "Do you have any symptoms?"     None 5. SEVERITY: If symptoms are present, ask "Are they mild, moderate or severe?"     Several episodes of vomiting last night.  Protocols used: Medication Question Call-A-AH

## 2021-02-21 NOTE — Progress Notes (Signed)
D/c Augmentin and sent in Amox 500mg  TID for 10 days to 

## 2021-03-14 ENCOUNTER — Ambulatory Visit (INDEPENDENT_AMBULATORY_CARE_PROVIDER_SITE_OTHER): Payer: Medicare PPO

## 2021-03-14 DIAGNOSIS — Z0000 Encounter for general adult medical examination without abnormal findings: Secondary | ICD-10-CM | POA: Diagnosis not present

## 2021-03-14 NOTE — Patient Instructions (Signed)
Amy Gonzales , Thank you for taking time to come for your Medicare Wellness Visit. I appreciate your ongoing commitment to your health goals. Please review the following plan we discussed and let me know if I can assist you in the future.   Screening recommendations/referrals: Colonoscopy: no longer required  Mammogram: done 12/29/20 Bone Density: done 2018 Recommended yearly ophthalmology/optometry visit for glaucoma screening and checkup Recommended yearly dental visit for hygiene and checkup  Vaccinations: Influenza vaccine: done 11/19/20 Pneumococcal vaccine: done 03/11/19 Tdap vaccine: done 2018 Shingles vaccine: Shingrix discussed. Please contact your pharmacy for coverage information.  Covid-19: done 2/5/1, 04/27/19 & 02/08/20  Advanced directives: Please bring a copy of your health care power of attorney and living will to the office at your convenience once you have completed those documents.   Conditions/risks identified: Keep up the great work!  Next appointment: Follow up in one year for your annual wellness visit    Preventive Care 65 Years and Older, Female Preventive care refers to lifestyle choices and visits with your health care provider that can promote health and wellness. What does preventive care include? A yearly physical exam. This is also called an annual well check. Dental exams once or twice a year. Routine eye exams. Ask your health care provider how often you should have your eyes checked. Personal lifestyle choices, including: Daily care of your teeth and gums. Regular physical activity. Eating a healthy diet. Avoiding tobacco and drug use. Limiting alcohol use. Practicing safe sex. Taking low-dose aspirin every day. Taking vitamin and mineral supplements as recommended by your health care provider. What happens during an annual well check? The services and screenings done by your health care provider during your annual well check will depend on your  age, overall health, lifestyle risk factors, and family history of disease. Counseling  Your health care provider may ask you questions about your: Alcohol use. Tobacco use. Drug use. Emotional well-being. Home and relationship well-being. Sexual activity. Eating habits. History of falls. Memory and ability to understand (cognition). Work and work Astronomerenvironment. Reproductive health. Screening  You may have the following tests or measurements: Height, weight, and BMI. Blood pressure. Lipid and cholesterol levels. These may be checked every 5 years, or more frequently if you are over 716 years old. Skin check. Lung cancer screening. You may have this screening every year starting at age 76 if you have a 30-pack-year history of smoking and currently smoke or have quit within the past 15 years. Fecal occult blood test (FOBT) of the stool. You may have this test every year starting at age 76. Flexible sigmoidoscopy or colonoscopy. You may have a sigmoidoscopy every 5 years or a colonoscopy every 10 years starting at age 76. Hepatitis C blood test. Hepatitis B blood test. Sexually transmitted disease (STD) testing. Diabetes screening. This is done by checking your blood sugar (glucose) after you have not eaten for a while (fasting). You may have this done every 1-3 years. Bone density scan. This is done to screen for osteoporosis. You may have this done starting at age 76. Mammogram. This may be done every 1-2 years. Talk to your health care provider about how often you should have regular mammograms. Talk with your health care provider about your test results, treatment options, and if necessary, the need for more tests. Vaccines  Your health care provider may recommend certain vaccines, such as: Influenza vaccine. This is recommended every year. Tetanus, diphtheria, and acellular pertussis (Tdap, Td) vaccine. You may need  a Td booster every 10 years. Zoster vaccine. You may need this after  age 9. Pneumococcal 13-valent conjugate (PCV13) vaccine. One dose is recommended after age 76. Pneumococcal polysaccharide (PPSV23) vaccine. One dose is recommended after age 53. Talk to your health care provider about which screenings and vaccines you need and how often you need them. This information is not intended to replace advice given to you by your health care provider. Make sure you discuss any questions you have with your health care provider. Document Released: 03/03/2015 Document Revised: 10/25/2015 Document Reviewed: 12/06/2014 Elsevier Interactive Patient Education  2017 [employer].  Fall Prevention in the Home Falls can cause injuries. They can happen to people of all ages. There are many things you can do to make your home safe and to help prevent falls. What can I do on the outside of my home? Regularly fix the edges of walkways and driveways and fix any cracks. Remove anything that might make you trip as you walk through a door, such as a raised step or threshold. Trim any bushes or trees on the path to your home. Use bright outdoor lighting. Clear any walking paths of anything that might make someone trip, such as rocks or tools. Regularly check to see if handrails are loose or broken. Make sure that both sides of any steps have handrails. Any raised decks and porches should have guardrails on the edges. Have any leaves, snow, or ice cleared regularly. Use sand or salt on walking paths during winter. Clean up any spills in your garage right away. This includes oil or grease spills. What can I do in the bathroom? Use night lights. Install grab bars by the toilet and in the tub and shower. Do not use towel bars as grab bars. Use non-skid mats or decals in the tub or shower. If you need to sit down in the shower, use a plastic, non-slip stool. Keep the floor dry. Clean up any water that spills on the floor as soon as it happens. Remove soap buildup in the tub or shower  regularly. Attach bath mats securely with double-sided non-slip rug tape. Do not have throw rugs and other things on the floor that can make you trip. What can I do in the bedroom? Use night lights. Make sure that you have a light by your bed that is easy to reach. Do not use any sheets or blankets that are too big for your bed. They should not hang down onto the floor. Have a firm chair that has side arms. You can use this for support while you get dressed. Do not have throw rugs and other things on the floor that can make you trip. What can I do in the kitchen? Clean up any spills right away. Avoid walking on wet floors. Keep items that you use a lot in easy-to-reach places. If you need to reach something above you, use a strong step stool that has a grab bar. Keep electrical cords out of the way. Do not use floor polish or wax that makes floors slippery. If you must use wax, use non-skid floor wax. Do not have throw rugs and other things on the floor that can make you trip. What can I do with my stairs? Do not leave any items on the stairs. Make sure that there are handrails on both sides of the stairs and use them. Fix handrails that are broken or loose. Make sure that handrails are as long as the stairways. Check  any carpeting to make sure that it is firmly attached to the stairs. Fix any carpet that is loose or worn. Avoid having throw rugs at the top or bottom of the stairs. If you do have throw rugs, attach them to the floor with carpet tape. Make sure that you have a light switch at the top of the stairs and the bottom of the stairs. If you do not have them, ask someone to add them for you. What else can I do to help prevent falls? Wear shoes that: Do not have high heels. Have rubber bottoms. Are comfortable and fit you well. Are closed at the toe. Do not wear sandals. If you use a stepladder: Make sure that it is fully opened. Do not climb a closed stepladder. Make sure that  both sides of the stepladder are locked into place. Ask someone to hold it for you, if possible. Clearly mark and make sure that you can see: Any grab bars or handrails. First and last steps. Where the edge of each step is. Use tools that help you move around (mobility aids) if they are needed. These include: Canes. Walkers. Scooters. Crutches. Turn on the lights when you go into a dark area. Replace any light bulbs as soon as they burn out. Set up your furniture so you have a clear path. Avoid moving your furniture around. If any of your floors are uneven, fix them. If there are any pets around you, be aware of where they are. Review your medicines with your doctor. Some medicines can make you feel dizzy. This can increase your chance of falling. Ask your doctor what other things that you can do to help prevent falls. This information is not intended to replace advice given to you by your health care provider. Make sure you discuss any questions you have with your health care provider. Document Released: 12/01/2008 Document Revised: 07/13/2015 Document Reviewed: 03/11/2014 Elsevier Interactive Patient Education  2017 [employer].

## 2021-03-14 NOTE — Progress Notes (Signed)
Subjective:   Amy Gonzales is a 76 y.o. female who presents for Medicare Annual (Subsequent) preventive examination.  Virtual Visit via Telephone Note  I connected with  Earmon Phoenix on 03/14/21 at 11:20 AM EST by telephone and verified that I am speaking with the correct person using two identifiers.  Location: Patient: home Provider: [facility] Persons participating in the virtual visit: patient/Nurse Health Advisor   I discussed the limitations, risks, security and privacy concerns of performing an evaluation and management service by telephone and the availability of in person appointments. The patient expressed understanding and agreed to proceed.  Interactive audio and video telecommunications were attempted between this nurse and patient, however failed, due to patient having technical difficulties OR patient did not have access to video capability.  We continued and completed visit with audio only.  Some vital signs may be absent or patient reported.   Reather Littler, LPN   Review of Systems     Cardiac Risk Factors include: advanced age (>102men, >57 women);dyslipidemia;hypertension     Objective:    There were no vitals filed for this visit. There is no height or weight on file to calculate BMI.  Advanced Directives 03/14/2021 03/13/2020  Does Patient Have a Medical Advance Directive? No No  Would patient like information on creating a medical advance directive? No - Patient declined No - Patient declined    Current Medications (verified) Outpatient Encounter Medications as of 03/14/2021  Medication Sig   cholecalciferol (VITAMIN D3) 25 MCG (1000 UNIT) tablet Take 1,000 Units by mouth daily.   Coenzyme Q10 (COQ10) 100 MG CAPS Take 1 capsule by mouth daily.   Evening Primrose Oil 1000 MG CAPS Take 1 capsule by mouth 2 (two) times daily.   fluticasone (FLONASE) 50 MCG/ACT nasal spray Place 1 spray into both nostrils as needed for allergies or rhinitis.   folic acid  (FOLVITE) 400 MCG tablet Take 400 mcg by mouth daily.   Glucos-Chondroit-Hyaluron-MSM (GLUCOSAMINE CHONDROITIN JOINT PO) Take 1 tablet by mouth 2 (two) times daily.   hydrochlorothiazide (MICROZIDE) 12.5 MG capsule TAKE (1) CAPSULE BY MOUTH EVERY DAY   Magnesium 250 MG TABS Take 1 tablet by mouth daily.   Multiple Vitamins-Minerals (CENTRUM SILVER PO) Take 1 tablet by mouth daily.   Omega-3 Fatty Acids (FISH OIL) 435 MG CAPS Take 1 capsule (435 mg total) by mouth 2 (two) times daily.   rosuvastatin (CRESTOR) 5 MG tablet TAKE (1) TABLET BY MOUTH EVERY DAY   [DISCONTINUED] guaiFENesin-codeine (ROBITUSSIN AC) 100-10 MG/5ML syrup Take 5 mLs by mouth 3 (three) times daily as needed for cough.   No facility-administered encounter medications on file as of 03/14/2021.    Allergies (verified) Clavulanic acid   History: Past Medical History:  Diagnosis Date   Hypercholesteremia    Hypertension    Past Surgical History:  Procedure Laterality Date   fatty tumor removed     from Buttocks   LIPOMA EXCISION Right    buttock   MELANOMA EXCISION     face   WISDOM TOOTH EXTRACTION     Family History  Problem Relation Age of Onset   Cancer Paternal Grandfather    Social History   Socioeconomic History   Marital status: Married    Spouse name: Not on file   Number of children: 0   Years of education: Not on file   Highest education level: Not on file  Occupational History   Not on file  Tobacco Use  Smoking status: Never   Smokeless tobacco: Never  Vaping Use   Vaping Use: Never used  Substance and Sexual Activity   Alcohol use: No   Drug use: No   Sexual activity: Yes  Other Topics Concern   Not on file  Social History Narrative   Not on file   Social Determinants of Health   Financial Resource Strain: Low Risk    Difficulty of Paying Living Expenses: Not hard at all  Food Insecurity: No Food Insecurity   Worried About Programme researcher, broadcasting/film/videounning Out of Food in the Last Year: Never true    Ran Out of Food in the Last Year: Never true  Transportation Needs: No Transportation Needs   Lack of Transportation (Medical): No   Lack of Transportation (Non-Medical): No  Physical Activity: Inactive   Days of Exercise per Week: 0 days   Minutes of Exercise per Session: 0 min  Stress: No Stress Concern Present   Feeling of Stress : Not at all  Social Connections: Moderately Integrated   Frequency of Communication with Friends and Family: More than three times a week   Frequency of Social Gatherings with Friends and Family: Three times a week   Attends Religious Services: More than 4 times per year   Active Member of Clubs or Organizations: No   Attends BankerClub or Organization Meetings: Never   Marital Status: Married    Tobacco Counseling Counseling given: Not Answered   Clinical Intake:  Pre-visit preparation completed: Yes  Pain : No/denies pain     Nutritional Risks: None Diabetes: No  How often do you need to have someone help you when you read instructions, pamphlets, or other written materials from your doctor or pharmacy?: 1 - Never    Interpreter Needed?: No  Information entered by :: Reather LittlerKasey Viva Gallaher LPN   Activities of Daily Living In your present state of health, do you have any difficulty performing the following activities: 03/14/2021  Hearing? N  Vision? N  Difficulty concentrating or making decisions? N  Walking or climbing stairs? N  Dressing or bathing? N  Doing errands, shopping? N  Preparing Food and eating ? N  Using the Toilet? N  In the past six months, have you accidently leaked urine? N  Do you have problems with loss of bowel control? N  Managing your Medications? N  Managing your Finances? N  Housekeeping or managing your Housekeeping? N  Some recent data might be hidden    Patient Care Team: Duanne LimerickJones, Deanna C, MD as PCP - General (Family Medicine) Schermerhorn, Ihor Austinhomas J, MD as Referring Physician (Obstetrics and Gynecology) Isla PenceWoodard, D  Reid, OD (Optometry)  Indicate any recent Medical Services you may have received from other than [facility] providers in the past year (date may be approximate).     Assessment:   This is a routine wellness examination for [place].  Hearing/Vision screen Hearing Screening - Comments:: Pt denies hearing difficulty Vision Screening - Comments:: [facility]  Dietary issues and exercise activities discussed: Current Exercise Habits: The patient does not participate in regular exercise at present (active at home), Exercise limited by: None identified   Goals Addressed             This Visit's Progress    Increase physical activity   On track    Recommend increasing physical activity to at least 3 days per week        Depression Screen [facility] 2/9 Scores 03/14/2021 09/21/2020 04/05/2020  03/13/2020 09/09/2019 03/11/2019  PHQ - 2 Score 0 0 0 0 0 0  PHQ- 9 Score - 0 0 - 0 0    Fall Risk Fall Risk  03/14/2021 09/21/2020 04/05/2020 03/13/2020 09/09/2019  Falls in the past year? 0 0 0 0 0  Number falls in past yr: 0 0 - 0 -  Injury with Fall? 0 0 - 0 -  Risk for fall due to : No Fall Risks No Fall Risks - No Fall Risks -  Follow up Falls prevention discussed Falls evaluation completed Falls evaluation completed Falls prevention discussed Falls evaluation completed    FALL RISK PREVENTION PERTAINING TO THE HOME:  Any stairs in or around the home? Yes  If so, are there any without handrails? No  Home free of loose throw rugs in walkways, pet beds, electrical cords, etc? Yes  Adequate lighting in your home to reduce risk of falls? Yes   ASSISTIVE DEVICES UTILIZED TO PREVENT FALLS:  Life alert? No  Use of a cane, walker or w/c? No  Grab bars in the bathroom? No  Shower chair or bench in shower? Yes  Elevated toilet seat or a handicapped toilet? No   TIMED UP AND GO:  Was the test performed? No . Telephonic visit.   Cognitive Function: Normal cognitive status  assessed by direct observation by this Nurse Health Advisor. No abnormalities found.          Immunizations Immunization History  Administered Date(s) Administered   Influenza, High Dose Seasonal PF 11/19/2019   Influenza,inj,Quad PF,6+ Mos 10/20/2018   Influenza-Unspecified 11/19/2020   Moderna Sars-Covid-2 Vaccination 03/26/2019, 04/27/2019, 01/30/2020   Pneumococcal Conjugate-13 02/19/2016   Pneumococcal Polysaccharide-23 03/11/2019   Tdap 02/19/2016    TDAP status: Up to date  Flu Vaccine status: Up to date  Pneumococcal vaccine status: Up to date  Covid-19 vaccine status: Completed vaccines  Qualifies for Shingles Vaccine? Yes   Zostavax completed No   Shingrix Completed?: No.    Education has been provided regarding the importance of this vaccine. Patient has been advised to call insurance company to determine out of pocket expense if they have not yet received this vaccine. Advised may also receive vaccine at local pharmacy or Health Dept. Verbalized acceptance and understanding.  Screening Tests Health Maintenance  Topic Date Due   COVID-19 Vaccine (4 - Booster for Moderna series) 03/26/2020   Zoster Vaccines- Shingrix (1 of 2) 05/21/2021 (Originally 01/09/1965)   COLONOSCOPY (Pts 45-68yrs Insurance coverage will need to be confirmed)  02/18/2025   TETANUS/TDAP  02/18/2026   Pneumonia Vaccine 71+ Years old  Completed   INFLUENZA VACCINE  Completed   DEXA SCAN  Completed   HPV VACCINES  Aged Out   Hepatitis C Screening  Discontinued    Health Maintenance  Health Maintenance Due  Topic Date Due   COVID-19 Vaccine (4 - Booster for Moderna series) 03/26/2020    Colorectal cancer screening: No longer required.   Mammogram status: Completed 12/29/20. Repeat every year  Bone Density status: Completed 2018. Results reflect: Bone density results: NORMAL. Repeat every 3-5 years.  Lung Cancer Screening: (Low Dose CT Chest recommended if Age 17-80 years, 30  pack-year currently smoking OR have quit w/in 15years.) does not qualify.   Additional Screening:  Hepatitis C Screening: does qualify; postponed  Vision Screening: Recommended annual ophthalmology exams for early detection of glaucoma and other disorders of the eye. Is the patient up to date with their annual eye exam?  Yes  Who is the provider or what is the name of the office in which the patient attends annual eye exams[facility]   Dental Screening: Recommended annual dental exams for proper oral hygiene  Community Resource Referral / Chronic Care Management: CRR required this visit?  No   CCM required this visit?  No      Plan:     I have personally reviewed and noted the following in the patients chart:   Medical and social history Use of alcohol, tobacco or illicit drugs  Current medications and supplements including opioid prescriptions.  Functional ability and status Nutritional status Physical activity Advanced directives List of other physicians Hospitalizations, surgeries, and ER visits in previous 12 months Vitals Screenings to include cognitive, depression, and falls Referrals and appointments  In addition, I have reviewed and discussed with patient certain preventive protocols, quality metrics, and best practice recommendations. A written personalized care plan for preventive services as well as general preventive health recommendations were provided to patient.     Reather LittlerKasey Safwan Tomei, LPN   8/11/91471/25/2023   Nurse Notes: none

## 2021-03-26 ENCOUNTER — Other Ambulatory Visit: Payer: Self-pay

## 2021-03-26 ENCOUNTER — Ambulatory Visit: Payer: Medicare PPO | Admitting: Family Medicine

## 2021-03-26 ENCOUNTER — Encounter: Payer: Self-pay | Admitting: Family Medicine

## 2021-03-26 VITALS — BP 130/80 | HR 68 | Ht 65.0 in | Wt 159.0 lb

## 2021-03-26 DIAGNOSIS — J301 Allergic rhinitis due to pollen: Secondary | ICD-10-CM | POA: Diagnosis not present

## 2021-03-26 DIAGNOSIS — I10 Essential (primary) hypertension: Secondary | ICD-10-CM

## 2021-03-26 DIAGNOSIS — E7801 Familial hypercholesterolemia: Secondary | ICD-10-CM | POA: Diagnosis not present

## 2021-03-26 MED ORDER — ROSUVASTATIN CALCIUM 5 MG PO TABS: ORAL_TABLET | ORAL | 1 refills | Status: DC

## 2021-03-26 MED ORDER — HYDROCHLOROTHIAZIDE 12.5 MG PO CAPS: ORAL_CAPSULE | ORAL | 1 refills | Status: DC

## 2021-03-26 MED ORDER — FLUTICASONE PROPIONATE 50 MCG/ACT NA SUSP: 1.0000 | NASAL | 1 refills | Status: DC | PRN

## 2021-03-26 NOTE — Progress Notes (Signed)
Date:  03/26/2021   Name:  Amy Gonzales   DOB:  08/29/1945   MRN:  [id number]   Chief Complaint: Allergic Rhinitis , Hyperlipidemia, and Hypertension  Hyperlipidemia This is a chronic problem. The current episode started more than 1 year ago. The problem is controlled. Recent lipid tests were reviewed and are normal. She has no history of chronic renal disease, diabetes, hypothyroidism, liver disease, obesity or nephrotic syndrome. There are no known factors aggravating her hyperlipidemia. Pertinent negatives include no chest pain, focal sensory loss, focal weakness, leg pain, myalgias or shortness of breath. Current antihyperlipidemic treatment includes statins. The current treatment provides moderate improvement of lipids. There are no compliance problems.  There are no known risk factors for coronary artery disease.  Hypertension This is a chronic problem. The current episode started more than 1 year ago. The problem has been gradually improving since onset. The problem is controlled. Pertinent negatives include no anxiety, blurred vision, chest pain, headaches, malaise/fatigue, neck pain, orthopnea, palpitations, peripheral edema, PND, shortness of breath or sweats. There are no associated agents to hypertension. Risk factors for coronary artery disease include dyslipidemia. Past treatments include nothing. The current treatment provides moderate improvement. There are no compliance problems.  There is no history of angina, kidney disease, CAD/MI, CVA, heart failure, left ventricular hypertrophy, PVD or retinopathy. There is no history of chronic renal disease, a hypertension causing med or renovascular disease.   Lab Results  Component Value Date   NA 138 04/07/2020   K 4.1 04/07/2020   CO2 23 04/07/2020   GLUCOSE 93 04/07/2020   BUN 15 04/07/2020   CREATININE 0.84 04/07/2020   CALCIUM 9.6 04/07/2020   GFRNONAA 69 04/07/2020   Lab Results  Component Value Date   CHOL 194 04/07/2020    HDL 81 04/07/2020   LDLCALC 97 04/07/2020   TRIG 92 04/07/2020   No results found for: TSH No results found for: HGBA1C No results found for: WBC, HGB, HCT, MCV, PLT Lab Results  Component Value Date   ALT 19 04/07/2020   AST 21 04/07/2020   ALKPHOS 53 04/07/2020   BILITOT 0.4 04/07/2020   No results found for: 25OHVITD2, 25OHVITD3, VD25OH   Review of Systems  Constitutional:  Negative for chills, fever and malaise/fatigue.  HENT:  Negative for drooling, ear discharge, ear pain and sore throat.   Eyes:  Negative for blurred vision.  Respiratory:  Negative for cough, shortness of breath and wheezing.   Cardiovascular:  Negative for chest pain, palpitations, orthopnea, leg swelling and PND.  Gastrointestinal:  Negative for abdominal pain, blood in stool, constipation, diarrhea and nausea.  Endocrine: Negative for polydipsia.  Genitourinary:  Negative for dysuria, frequency, hematuria and urgency.  Musculoskeletal:  Negative for back pain, myalgias and neck pain.  Skin:  Negative for rash.  Allergic/Immunologic: Negative for environmental allergies.  Neurological:  Negative for dizziness, focal weakness and headaches.  Hematological:  Does not bruise/bleed easily.  Psychiatric/Behavioral:  Negative for suicidal ideas. The patient is not nervous/anxious.    There are no problems to display for this patient.   Allergies  Allergen Reactions   Clavulanic Acid Nausea And Vomiting    Can take Amoxicillin    Past Surgical History:  Procedure Laterality Date   fatty tumor removed     from [place] Right    buttock   MELANOMA EXCISION     face   WISDOM TOOTH EXTRACTION  Social History   Tobacco Use   Smoking status: Never   Smokeless tobacco: Never  Vaping Use   Vaping Use: Never used  Substance Use Topics   Alcohol use: No   Drug use: No     Medication list has been reviewed and updated.  Current Meds  Medication Sig   cholecalciferol  (VITAMIN D3) 25 MCG (1000 UNIT) tablet Take 1,000 Units by mouth daily.   Coenzyme Q10 (COQ10) 100 MG CAPS Take 1 capsule by mouth daily.   Evening Primrose Oil 1000 MG CAPS Take 1 capsule by mouth 2 (two) times daily.   fluticasone (FLONASE) 50 MCG/ACT nasal spray Place 1 spray into both nostrils as needed for allergies or rhinitis.   folic acid (FOLVITE) A999333 MCG tablet Take 400 mcg by mouth daily.   Glucos-Chondroit-Hyaluron-MSM (GLUCOSAMINE CHONDROITIN JOINT PO) Take 1 tablet by mouth 2 (two) times daily.   hydrochlorothiazide (MICROZIDE) 12.5 MG capsule TAKE (1) CAPSULE BY MOUTH EVERY DAY   Magnesium 250 MG TABS Take 1 tablet by mouth daily.   Multiple Vitamins-Minerals (CENTRUM SILVER PO) Take 1 tablet by mouth daily.   Omega-3 Fatty Acids (FISH OIL) 435 MG CAPS Take 1 capsule (435 mg total) by mouth 2 (two) times daily.   rosuvastatin (CRESTOR) 5 MG tablet TAKE (1) TABLET BY MOUTH EVERY DAY    PHQ 2/9 Scores 03/26/2021 03/14/2021 09/21/2020 04/05/2020  PHQ - 2 Score 0 0 0 0  PHQ- 9 Score 0 - 0 0    GAD 7 : Generalized Anxiety Score 03/26/2021 09/21/2020 04/05/2020 09/09/2019  Nervous, Anxious, on Edge 0 0 0 0  Control/stop worrying 0 0 0 0  Worry too much - different things 0 0 0 0  Trouble relaxing 0 0 0 0  Restless 0 0 0 0  Easily annoyed or irritable 0 0 0 0  Afraid - awful might happen 0 0 0 0  Total GAD 7 Score 0 0 0 0  Anxiety Difficulty Not difficult at all - - -    BP Readings from Last 3 Encounters:  03/26/21 130/80  02/20/21 (!) 146/100  12/11/20 138/70    Physical Exam Vitals and nursing note reviewed.  Constitutional:      Appearance: She is well-developed.  HENT:     Head: Normocephalic.     Right Ear: Tympanic membrane and external ear normal. There is no impacted cerumen.     Left Ear: Tympanic membrane and external ear normal. There is no impacted cerumen.     Nose: Nose normal. No congestion or rhinorrhea.  Eyes:     General: Lids are everted, no foreign  bodies appreciated. No scleral icterus.       Left eye: No foreign body or hordeolum.     Conjunctiva/sclera: Conjunctivae normal.     Right eye: Right conjunctiva is not injected.     Left eye: Left conjunctiva is not injected.     Pupils: Pupils are equal, round, and reactive to light.  Neck:     Thyroid: No thyromegaly.     Vascular: No JVD.     Trachea: No tracheal deviation.  Cardiovascular:     Rate and Rhythm: Normal rate and regular rhythm.     Heart sounds: Normal heart sounds. No murmur heard.   No friction rub. No gallop.  Pulmonary:     Effort: Pulmonary effort is normal. No respiratory distress.     Breath sounds: Normal breath sounds. No wheezing, rhonchi or rales.  Chest:  Chest wall: No tenderness.  Abdominal:     General: Bowel sounds are normal.     Palpations: Abdomen is soft. There is no mass.     Tenderness: There is no abdominal tenderness. There is no guarding or rebound.  Musculoskeletal:        General: No tenderness. Normal range of motion.     Cervical back: Normal range of motion and neck supple.  Lymphadenopathy:     Cervical: No cervical adenopathy.  Skin:    General: Skin is warm.     Findings: No rash.  Neurological:     Mental Status: She is alert and oriented to person, place, and time.     Cranial Nerves: No cranial nerve deficit.     Deep Tendon Reflexes: Reflexes normal.  Psychiatric:        Mood and Affect: Mood is not anxious or depressed.    Wt Readings from Last 3 Encounters:  03/26/21 159 lb (72.1 kg)  02/20/21 159 lb (72.1 kg)  12/11/20 158 lb (71.7 kg)    BP 130/80    Pulse 68    Ht 5\' 5"  (1.651 m)    Wt 159 lb (72.1 kg)    BMI 26.46 kg/m   Assessment and Plan:  1. Seasonal allergic rhinitis due to pollen Chronic.  Controlled.  Stable.  Continue Flonase nasal spray 1 spray both nostrils as needed. - fluticasone (FLONASE) 50 MCG/ACT nasal spray; Place 1 spray into both nostrils as needed for allergies or rhinitis.   Dispense: 9.9 mL; Refill: 1  2. Essential hypertension Chronic.  Controlled.  Stable.  Blood pressure is 130/80.  Continue hydrochlorothiazide 12.5 mg once a day.  Will check CMP for electrolytes and GFR. - hydrochlorothiazide (MICROZIDE) 12.5 MG capsule; Take 1 capsule by mouth daily  Dispense: 90 capsule; Refill: 1 - Comprehensive Metabolic Panel (CMET)  3. Familial hypercholesterolemia Chronic.  Controlled.  Stable.  Continue rosuvastatin 5 mg once a day.  Continue with current dosing pending lipid panel and control of LDL and triglycerides.  As well as ways we have suggested watching dietary intake of cholesterol and saturated fats - rosuvastatin (CRESTOR) 5 MG tablet; TAKE (1) TABLET BY MOUTH EVERY DAY  Dispense: 90 tablet; Refill: 1 - Lipid Panel With LDL/HDL Ratio

## 2021-03-27 LAB — COMPREHENSIVE METABOLIC PANEL
ALT: 15 IU/L (ref 0–32)
AST: 19 IU/L (ref 0–40)
Albumin/Globulin Ratio: 2 (ref 1.2–2.2)
Albumin: 4.3 g/dL (ref 3.7–4.7)
Alkaline Phosphatase: 58 IU/L (ref 44–121)
BUN/Creatinine Ratio: 15 (ref 12–28)
BUN: 12 mg/dL (ref 8–27)
Bilirubin Total: 0.4 mg/dL (ref 0.0–1.2)
CO2: 24 mmol/L (ref 20–29)
Calcium: 9.8 mg/dL (ref 8.7–10.3)
Chloride: 98 mmol/L (ref 96–106)
Creatinine, Ser: 0.81 mg/dL (ref 0.57–1.00)
Globulin, Total: 2.2 g/dL (ref 1.5–4.5)
Glucose: 90 mg/dL (ref 70–99)
Potassium: 4.3 mmol/L (ref 3.5–5.2)
Sodium: 138 mmol/L (ref 134–144)
Total Protein: 6.5 g/dL (ref 6.0–8.5)
eGFR: 76 mL/min/{1.73_m2} (ref >59)

## 2021-03-27 LAB — LIPID PANEL WITH LDL/HDL RATIO
Cholesterol, Total: 187 mg/dL (ref 100–199)
HDL: 71 mg/dL (ref >39)
LDL Chol Calc (NIH): 97 mg/dL (ref 0–99)
LDL/HDL Ratio: 1.4 ratio (ref 0.0–3.2)
Triglycerides: 106 mg/dL (ref 0–149)
VLDL Cholesterol Cal: 19 mg/dL (ref 5–40)

## 2021-06-27 DIAGNOSIS — L986 Other infiltrative disorders of the skin and subcutaneous tissue: Secondary | ICD-10-CM | POA: Diagnosis not present

## 2021-06-27 DIAGNOSIS — Z859 Personal history of malignant neoplasm, unspecified: Secondary | ICD-10-CM | POA: Diagnosis not present

## 2021-06-27 DIAGNOSIS — L578 Other skin changes due to chronic exposure to nonionizing radiation: Secondary | ICD-10-CM | POA: Diagnosis not present

## 2021-06-27 DIAGNOSIS — D485 Neoplasm of uncertain behavior of skin: Secondary | ICD-10-CM | POA: Diagnosis not present

## 2021-06-27 DIAGNOSIS — Z86018 Personal history of other benign neoplasm: Secondary | ICD-10-CM | POA: Diagnosis not present

## 2021-06-27 DIAGNOSIS — Z85820 Personal history of malignant melanoma of skin: Secondary | ICD-10-CM | POA: Diagnosis not present

## 2021-06-27 DIAGNOSIS — Z872 Personal history of diseases of the skin and subcutaneous tissue: Secondary | ICD-10-CM | POA: Diagnosis not present

## 2021-06-27 DIAGNOSIS — D2271 Melanocytic nevi of right lower limb, including hip: Secondary | ICD-10-CM | POA: Diagnosis not present

## 2021-07-26 DIAGNOSIS — L814 Other melanin hyperpigmentation: Secondary | ICD-10-CM | POA: Diagnosis not present

## 2021-07-26 DIAGNOSIS — D0359 Melanoma in situ of other part of trunk: Secondary | ICD-10-CM | POA: Diagnosis not present

## 2021-09-24 ENCOUNTER — Encounter: Payer: Self-pay | Admitting: Family Medicine

## 2021-09-24 ENCOUNTER — Ambulatory Visit: Payer: Medicare PPO | Admitting: Family Medicine

## 2021-09-24 VITALS — BP 132/80 | HR 64 | Ht 65.0 in | Wt 158.0 lb

## 2021-09-24 DIAGNOSIS — I10 Essential (primary) hypertension: Secondary | ICD-10-CM

## 2021-09-24 DIAGNOSIS — E7801 Familial hypercholesterolemia: Secondary | ICD-10-CM

## 2021-09-24 DIAGNOSIS — J301 Allergic rhinitis due to pollen: Secondary | ICD-10-CM

## 2021-09-24 MED ORDER — HYDROCHLOROTHIAZIDE 12.5 MG PO CAPS: ORAL_CAPSULE | ORAL | 1 refills | Status: DC

## 2021-09-24 MED ORDER — FLUTICASONE PROPIONATE 50 MCG/ACT NA SUSP: 1.0000 | NASAL | 1 refills | Status: DC | PRN

## 2021-09-24 NOTE — Progress Notes (Signed)
Date:  09/24/2021   Name:  Amy Gonzales   DOB:  08/19/1945   MRN:  [id number]   Chief Complaint: Hypertension, Allergic Rhinitis , and Hyperlipidemia  Hypertension This is a chronic problem. The current episode started more than 1 year ago. The problem has been gradually improving since onset. The problem is controlled. Pertinent negatives include no anxiety, blurred vision, chest pain, headaches, malaise/fatigue, neck pain, orthopnea, palpitations, peripheral edema, PND, shortness of breath or sweats. There are no known risk factors for coronary artery disease. Past treatments include diuretics. The current treatment provides moderate improvement. There are no compliance problems.  There is no history of angina, kidney disease, CAD/MI, CVA, heart failure, left ventricular hypertrophy, PVD or retinopathy. There is no history of chronic renal disease, a hypertension causing med or renovascular disease.  Hyperlipidemia This is a chronic problem. The current episode started more than 1 year ago. The problem is controlled. Recent lipid tests were reviewed and are normal. She has no history of chronic renal disease, diabetes, hypothyroidism or nephrotic syndrome. Factors aggravating her hyperlipidemia include thiazides. Pertinent negatives include no chest pain, focal sensory loss, myalgias or shortness of breath. Current antihyperlipidemic treatment includes statins. The current treatment provides moderate improvement of lipids. There are no compliance problems.  Risk factors for coronary artery disease include dyslipidemia.    Lab Results  Component Value Date   NA 138 03/26/2021   K 4.3 03/26/2021   CO2 24 03/26/2021   GLUCOSE 90 03/26/2021   BUN 12 03/26/2021   CREATININE 0.81 03/26/2021   CALCIUM 9.8 03/26/2021   EGFR 76 03/26/2021   GFRNONAA 69 04/07/2020   Lab Results  Component Value Date   CHOL 187 03/26/2021   HDL 71 03/26/2021   LDLCALC 97 03/26/2021   TRIG 106 03/26/2021    No results found for: "TSH" No results found for: "HGBA1C" No results found for: "WBC", "HGB", "HCT", "MCV", "PLT" Lab Results  Component Value Date   ALT 15 03/26/2021   AST 19 03/26/2021   ALKPHOS 58 03/26/2021   BILITOT 0.4 03/26/2021   No results found for: "25OHVITD2", "25OHVITD3", "VD25OH"   Review of Systems  Constitutional:  Negative for chills, fever and malaise/fatigue.  HENT:  Negative for drooling, ear discharge, ear pain and sore throat.   Eyes:  Negative for blurred vision.  Respiratory:  Negative for cough, shortness of breath and wheezing.   Cardiovascular:  Negative for chest pain, palpitations, orthopnea, leg swelling and PND.  Gastrointestinal:  Negative for abdominal pain, blood in stool, constipation, diarrhea and nausea.  Endocrine: Negative for polydipsia.  Genitourinary:  Negative for dysuria, frequency, hematuria and urgency.  Musculoskeletal:  Negative for back pain, myalgias and neck pain.  Skin:  Negative for rash.  Allergic/Immunologic: Negative for environmental allergies.  Neurological:  Negative for dizziness and headaches.  Hematological:  Does not bruise/bleed easily.  Psychiatric/Behavioral:  Negative for suicidal ideas. The patient is not nervous/anxious.     There are no problems to display for this patient.   Allergies  Allergen Reactions   Clavulanic Acid Nausea And Vomiting    Can take Amoxicillin    Past Surgical History:  Procedure Laterality Date   fatty tumor removed     from [place] Right    buttock   MELANOMA EXCISION     face   WISDOM TOOTH EXTRACTION      Social History   Tobacco Use   Smoking status: Never  Smokeless tobacco: Never  Vaping Use   Vaping Use: Never used  Substance Use Topics   Alcohol use: No   Drug use: No     Medication list has been reviewed and updated.  Current Meds  Medication Sig   cholecalciferol (VITAMIN D3) 25 MCG (1000 UNIT) tablet Take 1,000 Units by  mouth daily.   Coenzyme Q10 (COQ10) 100 MG CAPS Take 1 capsule by mouth daily.   Evening Primrose Oil 1000 MG CAPS Take 1 capsule by mouth 2 (two) times daily.   fluticasone (FLONASE) 50 MCG/ACT nasal spray Place 1 spray into both nostrils as needed for allergies or rhinitis.   folic acid (FOLVITE) 536 MCG tablet Take 400 mcg by mouth daily.   Glucos-Chondroit-Hyaluron-MSM (GLUCOSAMINE CHONDROITIN JOINT PO) Take 1 tablet by mouth 2 (two) times daily.   hydrochlorothiazide (MICROZIDE) 12.5 MG capsule Take 1 capsule by mouth daily   Magnesium 250 MG TABS Take 1 tablet by mouth daily.   Multiple Vitamins-Minerals (CENTRUM SILVER PO) Take 1 tablet by mouth daily.   Omega-3 Fatty Acids (FISH OIL) 435 MG CAPS Take 1 capsule (435 mg total) by mouth 2 (two) times daily.   rosuvastatin (CRESTOR) 5 MG tablet TAKE (1) TABLET BY MOUTH EVERY DAY (Patient taking differently: Take 5 mg by mouth. TAKE (1) TABLET BY MOUTH EVERY OTHER DAY)       09/24/2021   10:30 AM 03/26/2021   10:23 AM 09/21/2020    9:30 AM 04/05/2020   11:19 AM  GAD 7 : Generalized Anxiety Score  Nervous, Anxious, on Edge 0 0 0 0  Control/stop worrying 0 0 0 0  Worry too much - different things 0 0 0 0  Trouble relaxing 0 0 0 0  Restless 0 0 0 0  Easily annoyed or irritable 0 0 0 0  Afraid - awful might happen 0 0 0 0  Total GAD 7 Score 0 0 0 0  Anxiety Difficulty Not difficult at all Not difficult at all         09/24/2021   10:30 AM 03/26/2021   10:23 AM 03/14/2021   11:31 AM  Depression screen PHQ 2/9  Decreased Interest 0 0 0  Down, Depressed, Hopeless 0 0 0  PHQ - 2 Score 0 0 0  Altered sleeping 0 0   Tired, decreased energy 0 0   Change in appetite 0 0   Feeling bad or failure about yourself  0 0   Trouble concentrating 0 0   Moving slowly or fidgety/restless 0 0   Suicidal thoughts 0 0   PHQ-9 Score 0 0   Difficult doing work/chores Not difficult at all      BP Readings from Last 3 Encounters:  09/24/21 132/80   03/26/21 130/80  02/20/21 (!) 146/100    Physical Exam Vitals and nursing note reviewed.  Constitutional:      Appearance: She is well-developed.  HENT:     Head: Normocephalic.     Right Ear: Tympanic membrane and external ear normal.     Left Ear: Tympanic membrane and external ear normal.     Nose: No congestion or rhinorrhea.     Mouth/Throat:     Mouth: Mucous membranes are moist.  Eyes:     General: Lids are everted, no foreign bodies appreciated. No scleral icterus.       Left eye: No foreign body or hordeolum.     Conjunctiva/sclera: Conjunctivae normal.     Right eye: Right  conjunctiva is not injected.     Left eye: Left conjunctiva is not injected.     Pupils: Pupils are equal, round, and reactive to light.  Neck:     Thyroid: No thyromegaly.     Vascular: No JVD.     Trachea: No tracheal deviation.  Cardiovascular:     Rate and Rhythm: Normal rate and regular rhythm.     Heart sounds: Normal heart sounds. No murmur heard.    No friction rub. No gallop.  Pulmonary:     Effort: Pulmonary effort is normal. No respiratory distress.     Breath sounds: Normal breath sounds. No wheezing, rhonchi or rales.  Abdominal:     General: Bowel sounds are normal.     Palpations: Abdomen is soft. There is no mass.     Tenderness: There is no abdominal tenderness. There is no guarding or rebound.  Musculoskeletal:        General: No tenderness. Normal range of motion.     Cervical back: Normal range of motion and neck supple.  Lymphadenopathy:     Cervical: No cervical adenopathy.  Skin:    General: Skin is warm.     Findings: No rash.  Neurological:     Mental Status: She is alert and oriented to person, place, and time.     Cranial Nerves: No cranial nerve deficit.     Deep Tendon Reflexes: Reflexes normal.  Psychiatric:        Mood and Affect: Mood is not anxious or depressed.     Wt Readings from Last 3 Encounters:  09/24/21 158 lb (71.7 kg)  03/26/21 159 lb  (72.1 kg)  02/20/21 159 lb (72.1 kg)    BP 132/80   Pulse 64   Ht _0  (1.651 m)   Wt 158 lb (71.7 kg)   BMI 26.29 kg/m   Assessment and Plan: 1. Essential hypertension Chronic.  Controlled.  Stable blood pressure 132/80.  Asymptomatic.  Tolerating hydrochlorothiazide well.  Continue hydrochlorothiazide 12.5 mg.  We will recheck in 6 months. - hydrochlorothiazide (MICROZIDE) 12.5 MG capsule; Take 1 capsule by mouth daily  Dispense: 90 capsule; Refill: 1  2. Familial hypercholesterolemia Chronic.  Questionable control.  Stable.     Has cut rosuvastatin 5 mg to every other day.  Will check lipid panel for current surveillance of LDL. - Lipid Panel With LDL/HDL Ratio  3. Seasonal allergic rhinitis due to pollen Chronic.  Controlled.  Stable.  Continue Flonase nasal spray 1 spray both nostrils once a day - fluticasone (FLONASE) 50 MCG/ACT nasal spray; Place 1 spray into both nostrils as needed for allergies or rhinitis.  Dispense: 9.9 mL; Refill: 1     Otilio Miu, MD

## 2021-09-25 LAB — LIPID PANEL WITH LDL/HDL RATIO
Cholesterol, Total: 187 mg/dL (ref 100–199)
HDL: 71 mg/dL (ref >39)
LDL Chol Calc (NIH): 98 mg/dL (ref 0–99)
LDL/HDL Ratio: 1.4 ratio (ref 0.0–3.2)
Triglycerides: 100 mg/dL (ref 0–149)
VLDL Cholesterol Cal: 18 mg/dL (ref 5–40)

## 2021-10-11 ENCOUNTER — Encounter: Payer: Self-pay | Admitting: Family Medicine

## 2021-10-11 ENCOUNTER — Ambulatory Visit: Payer: Medicare PPO | Admitting: Family Medicine

## 2021-10-11 VITALS — BP 128/80 | HR 77 | Ht 65.0 in | Wt 158.6 lb

## 2021-10-11 DIAGNOSIS — J019 Acute sinusitis, unspecified: Secondary | ICD-10-CM

## 2021-10-11 DIAGNOSIS — B9689 Other specified bacterial agents as the cause of diseases classified elsewhere: Secondary | ICD-10-CM

## 2021-10-11 MED ORDER — AMOXICILLIN 500 MG PO CAPS: 500.0000 mg | ORAL_CAPSULE | Freq: Three times a day (TID) | ORAL | 0 refills | Status: AC

## 2021-10-11 MED ORDER — GUAIFENESIN ER 600 MG PO TB12: 600.0000 mg | ORAL_TABLET | Freq: Two times a day (BID) | ORAL | 0 refills | Status: AC

## 2021-10-11 NOTE — Patient Instructions (Signed)
-   Dose amoxicillin 3 times daily x10 days - Start Mucinex (guaifenesin) twice daily x10 days - Drink plenty of fluids and get plenty of rest - Contact our office for any lingering symptoms at the 10-day mark or beyond or for any issues between now and then

## 2021-10-11 NOTE — Progress Notes (Signed)
     Primary Care / Sports Medicine Office Visit  Patient Information:  Patient ID: Amy Gonzales, female DOB: 1945/07/23 Age: 76 y.o. MRN: [id number]   [name] is a pleasant 76 y.o. female presenting with the following:  Chief Complaint  Patient presents with   Sinusitis    1.5weeks, OTC medication and sinus rinses with no help. Yellow mucus with nasal rinse, when pt blows nose it is clear.     Vitals:   10/11/21 1004  BP: 128/80  Pulse: 77  SpO2: 98%   Vitals:   10/11/21 1004  Weight: 158 lb 9.6 oz (71.9 kg)  Height: 5\' 5"  (1.651 m)   Body mass index is 26.39 kg/m.  No results found.   Independent interpretation of notes and tests performed by another provider:   None  Procedures performed:   None  Pertinent History, Exam, Impression, and Recommendations:   Problem List Items Addressed This Visit       Respiratory   Acute bacterial rhinosinusitis - Primary    7-day history of sinus pressure, pain, yellow discharge, has been utilizing OTC regimens without lasting relief, no shortness of air.  Examination reveals tenderness throughout the sinuses, erythematous turbinates bilaterally with benign oropharynx, tympanic membranes, and canals.  Initially auscultated rhonchi in the upper fields that resolved after repetitive coughing.  I have advised patient to dose amoxicillin regimen, adjunct Mucinex, and contact for persistent symptoms after medication course.      Relevant Medications   amoxicillin (AMOXIL) 500 MG capsule   guaiFENesin (MUCINEX) 600 MG 12 hr tablet     Orders & Medications Meds ordered this encounter  Medications   amoxicillin (AMOXIL) 500 MG capsule    Sig: Take 1 capsule (500 mg total) by mouth 3 (three) times daily for 10 days.    Dispense:  30 capsule    Refill:  0   guaiFENesin (MUCINEX) 600 MG 12 hr tablet    Sig: Take 1 tablet (600 mg total) by mouth 2 (two) times daily for 10 days.    Dispense:  20 tablet    Refill:  0    No orders of the defined types were placed in this encounter.    Return if symptoms worsen or fail to improve.     [place], MD   Primary Care Sports Medicine [facility]

## 2021-10-11 NOTE — Assessment & Plan Note (Signed)
7-day history of sinus pressure, pain, yellow discharge, has been utilizing OTC regimens without lasting relief, no shortness of air.  Examination reveals tenderness throughout the sinuses, erythematous turbinates bilaterally with benign oropharynx, tympanic membranes, and canals.  Initially auscultated rhonchi in the upper fields that resolved after repetitive coughing.  I have advised patient to dose amoxicillin regimen, adjunct Mucinex, and contact us for persistent symptoms after medication course.

## 2021-12-05 ENCOUNTER — Other Ambulatory Visit: Payer: Self-pay | Admitting: Obstetrics and Gynecology

## 2021-12-05 DIAGNOSIS — Z1231 Encounter for screening mammogram for malignant neoplasm of breast: Secondary | ICD-10-CM

## 2021-12-18 IMAGING — MG MM DIGITAL SCREENING BILAT W/ TOMO AND CAD
8 series · 8 of 24 positions shown · non-contrast
Comparison: Previous exam(s).

CLINICAL DATA: Screening.

EXAM:
DIGITAL SCREENING BILATERAL MAMMOGRAM WITH TOMOSYNTHESIS AND CAD
TECHNIQUE: Bilateral screening digital craniocaudal and mediolateral oblique
mammograms were obtained. Bilateral screening digital breast
tomosynthesis was performed. The images were evaluated with
computer-aided detection.

[L CC synth-2D]
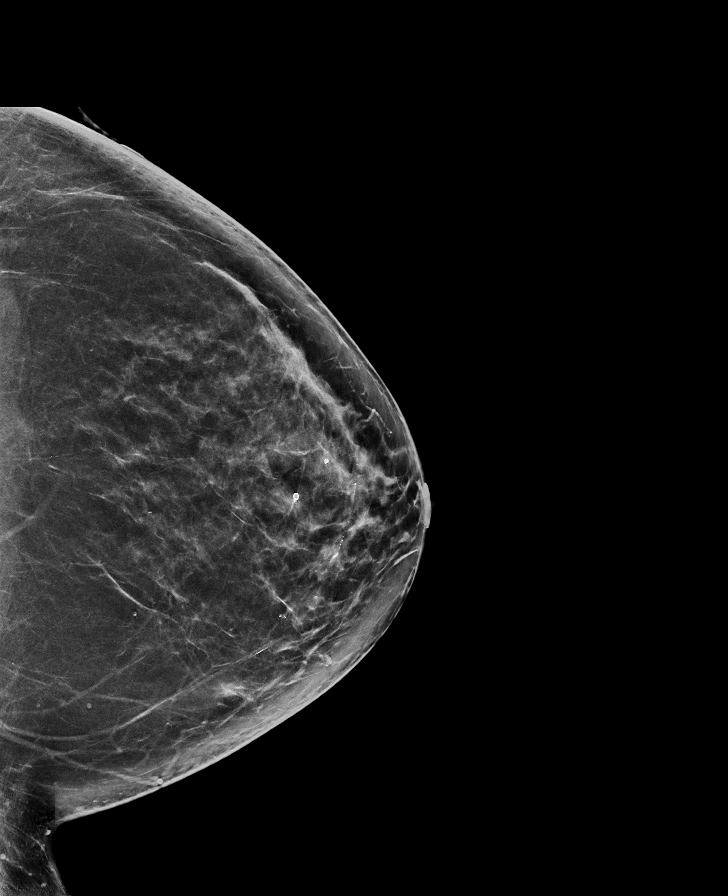

[R CC synth-2D]
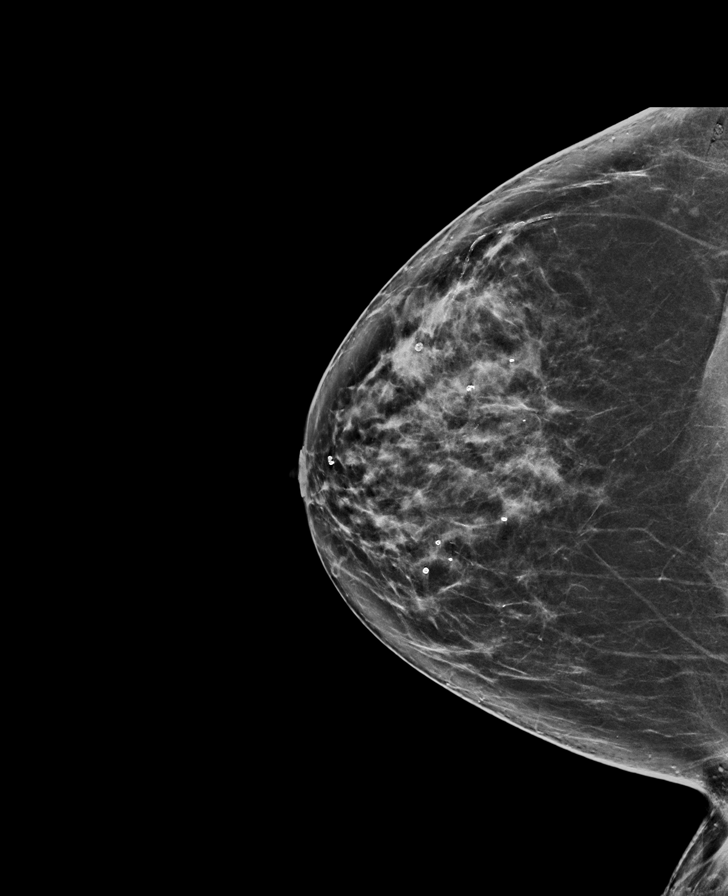

[R MLO synth-2D]
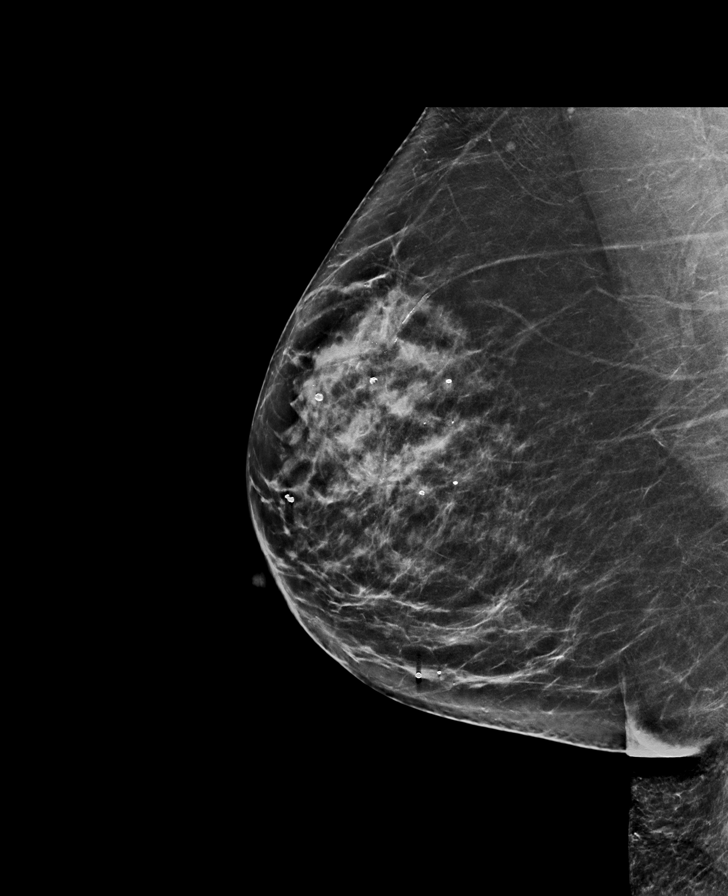

[L MLO synth-2D]
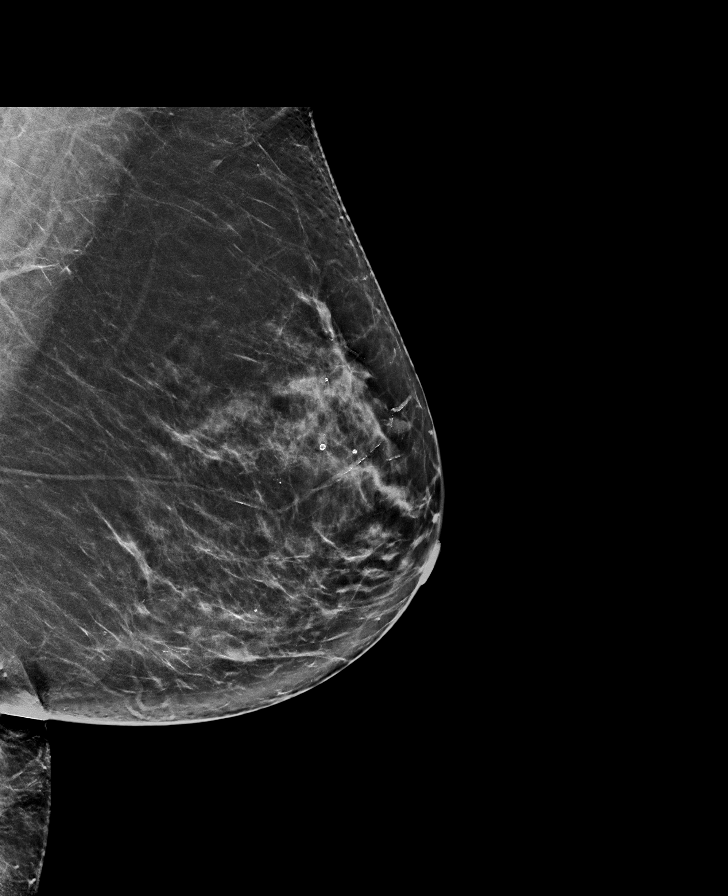

[R CC tomo · tomo slice 38/75.0]
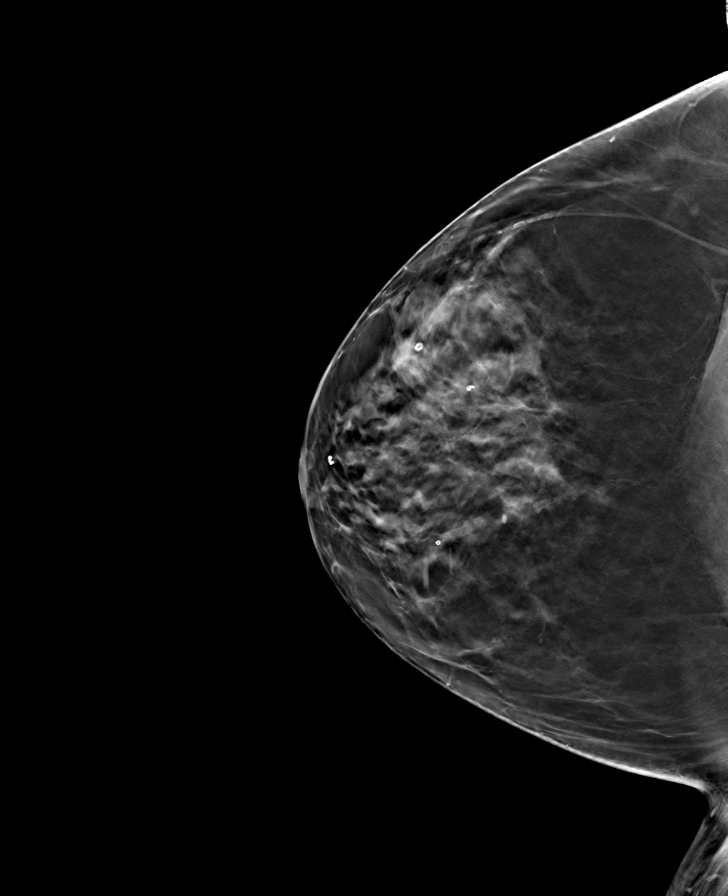

[L MLO tomo · tomo slice 38/75.0]
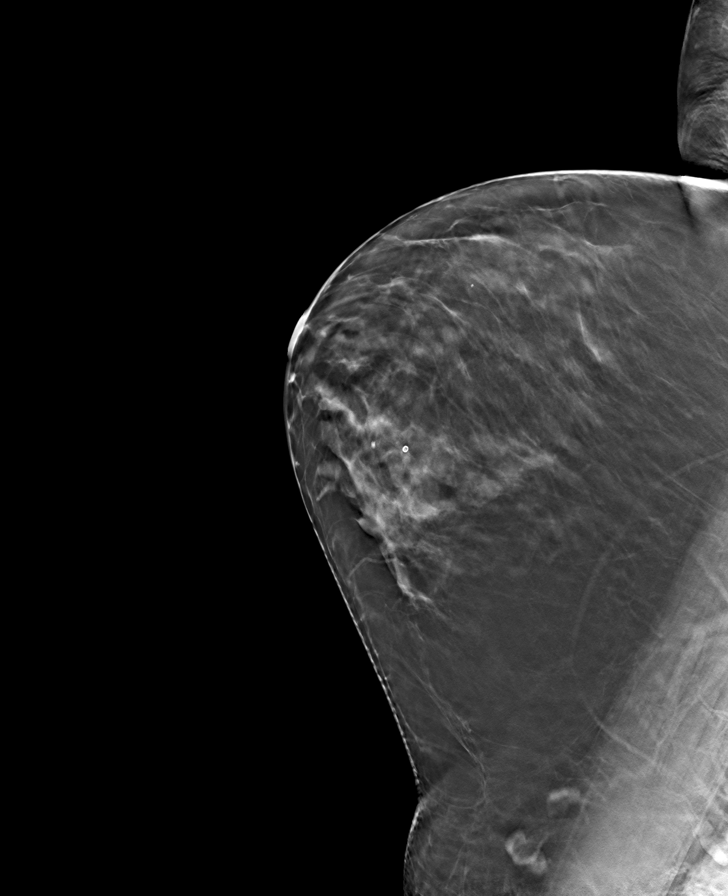

[L CC tomo · tomo slice 39/76.0]
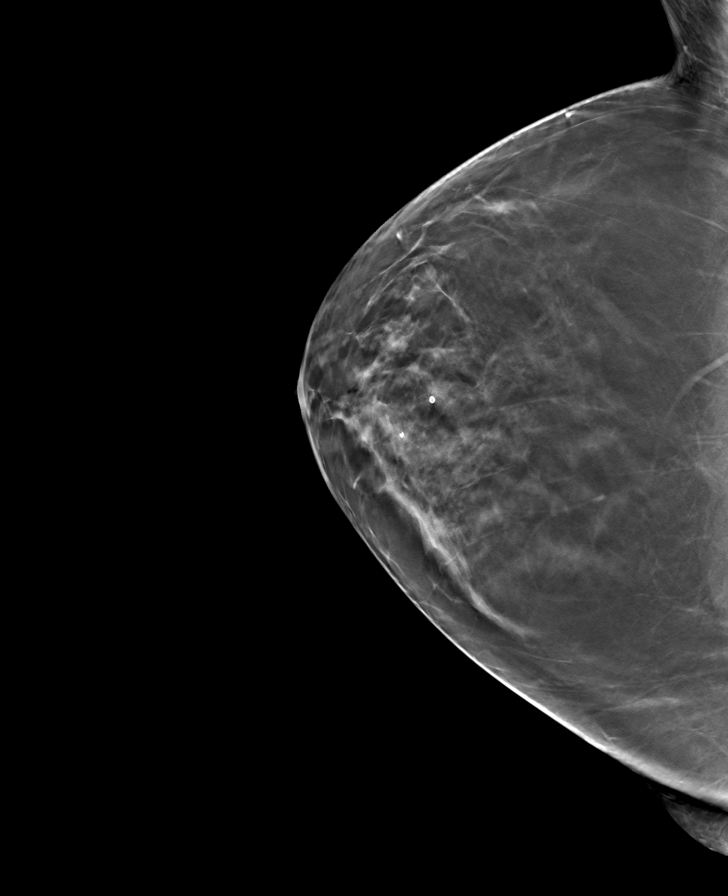

[R MLO tomo · tomo slice 37/74.0]
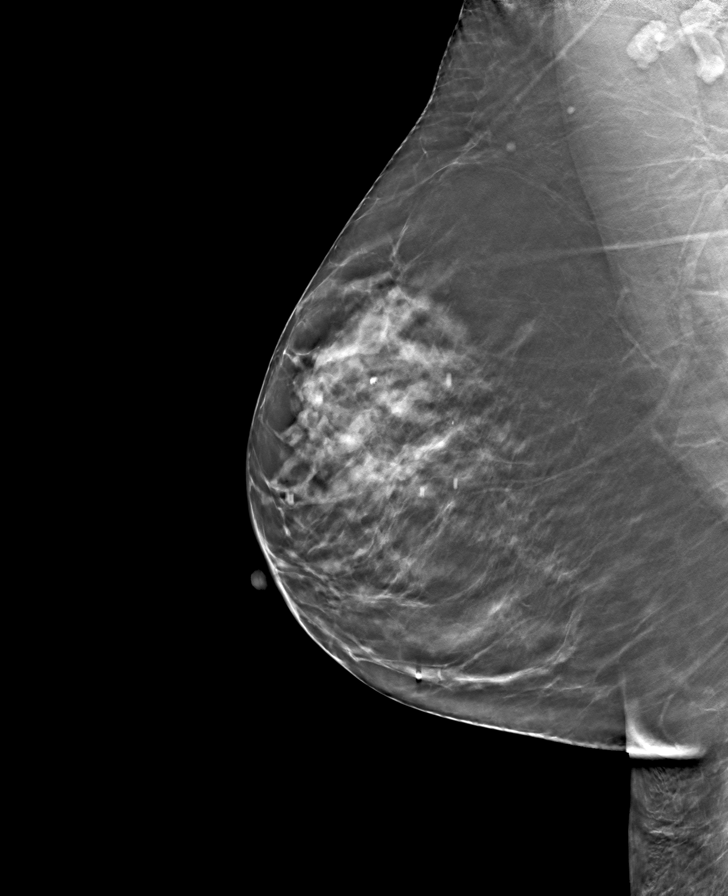

[8 of 24 positions shown; findings below may reference images not displayed]

ACR Breast Density Category c: The breast tissue is heterogeneously
dense, which may obscure small masses.
FINDINGS: There are no findings suspicious for malignancy.
IMPRESSION: No mammographic evidence of malignancy. A result letter of this
screening mammogram will be mailed directly to the patient.

RECOMMENDATION:
Screening mammogram in one year. (Code:[id number])

BI-RADS CATEGORY  1: Negative.

## 2021-12-31 ENCOUNTER — Ambulatory Visit
Admission: RE | Admit: 2021-12-31 | Discharge: 2021-12-31 | Disposition: A | Discharge status: Home / Self Care | Payer: Medicare PPO | Source: Ambulatory Visit | Attending: Obstetrics and Gynecology | Admitting: Obstetrics and Gynecology

## 2021-12-31 DIAGNOSIS — Z1231 Encounter for screening mammogram for malignant neoplasm of breast: Secondary | ICD-10-CM | POA: Insufficient documentation

## 2022-01-02 DIAGNOSIS — H2513 Age-related nuclear cataract, bilateral: Secondary | ICD-10-CM | POA: Diagnosis not present

## 2022-01-03 DIAGNOSIS — Z859 Personal history of malignant neoplasm, unspecified: Secondary | ICD-10-CM | POA: Diagnosis not present

## 2022-01-03 DIAGNOSIS — Z85820 Personal history of malignant melanoma of skin: Secondary | ICD-10-CM | POA: Diagnosis not present

## 2022-01-03 DIAGNOSIS — L578 Other skin changes due to chronic exposure to nonionizing radiation: Secondary | ICD-10-CM | POA: Diagnosis not present

## 2022-01-03 DIAGNOSIS — Z86018 Personal history of other benign neoplasm: Secondary | ICD-10-CM | POA: Diagnosis not present

## 2022-01-03 DIAGNOSIS — Z872 Personal history of diseases of the skin and subcutaneous tissue: Secondary | ICD-10-CM | POA: Diagnosis not present

## 2022-01-03 DIAGNOSIS — D2272 Melanocytic nevi of left lower limb, including hip: Secondary | ICD-10-CM | POA: Diagnosis not present

## 2022-01-03 DIAGNOSIS — L570 Actinic keratosis: Secondary | ICD-10-CM | POA: Diagnosis not present

## 2022-01-07 DIAGNOSIS — Z1331 Encounter for screening for depression: Secondary | ICD-10-CM | POA: Diagnosis not present

## 2022-01-07 DIAGNOSIS — Z01419 Encounter for gynecological examination (general) (routine) without abnormal findings: Secondary | ICD-10-CM | POA: Diagnosis not present

## 2022-03-06 DIAGNOSIS — Z1211 Encounter for screening for malignant neoplasm of colon: Secondary | ICD-10-CM | POA: Diagnosis not present

## 2022-03-18 ENCOUNTER — Ambulatory Visit (INDEPENDENT_AMBULATORY_CARE_PROVIDER_SITE_OTHER): Payer: Medicare PPO

## 2022-03-18 DIAGNOSIS — Z0000 Encounter for general adult medical examination without abnormal findings: Secondary | ICD-10-CM

## 2022-03-18 NOTE — Patient Instructions (Signed)
Health Maintenance, Female Adopting a healthy lifestyle and getting preventive care are important in promoting health and wellness. Ask your health care provider about: The right schedule for you to have regular tests and exams. Things you can do on your own to prevent diseases and keep yourself healthy. What should I know about diet, weight, and exercise? Eat a healthy diet  Eat a diet that includes plenty of vegetables, fruits, low-fat dairy products, and lean protein. Do not eat a lot of foods that are high in solid fats, added sugars, or sodium. Maintain a healthy weight Body mass index (BMI) is used to identify weight problems. It estimates body fat based on height and weight. Your health care provider can help determine your BMI and help you achieve or maintain a healthy weight. Get regular exercise Get regular exercise. This is one of the most important things you can do for your health. Most adults should: Exercise for at least 150 minutes each week. The exercise should increase your heart rate and make you sweat (moderate-intensity exercise). Do strengthening exercises at least twice a week. This is in addition to the moderate-intensity exercise. Spend less time sitting. Even light physical activity can be beneficial. Watch cholesterol and blood lipids Have your blood tested for lipids and cholesterol at 77 years of age, then have this test every 5 years. Have your cholesterol levels checked more often if: Your lipid or cholesterol levels are high. You are older than 77 years of age. You are at high risk for heart disease. What should I know about cancer screening? Depending on your health history and family history, you may need to have cancer screening at various ages. This may include screening for: Breast cancer. Cervical cancer. Colorectal cancer. Skin cancer. Lung cancer. What should I know about heart disease, diabetes, and high blood pressure? Blood pressure and heart  disease High blood pressure causes heart disease and increases the risk of stroke. This is more likely to develop in people who have high blood pressure readings or are overweight. Have your blood pressure checked: Every 3-5 years if you are 18-39 years of age. Every year if you are 40 years old or older. Diabetes Have regular diabetes screenings. This checks your fasting blood sugar level. Have the screening done: Once every three years after age 40 if you are at a normal weight and have a low risk for diabetes. More often and at a younger age if you are overweight or have a high risk for diabetes. What should I know about preventing infection? Hepatitis B If you have a higher risk for hepatitis B, you should be screened for this virus. Talk with your health care provider to find out if you are at risk for hepatitis B infection. Hepatitis C Testing is recommended for: Everyone born from 1945 through 1965. Anyone with known risk factors for hepatitis C. Sexually transmitted infections (STIs) Get screened for STIs, including gonorrhea and chlamydia, if: You are sexually active and are younger than 77 years of age. You are older than 77 years of age and your health care provider tells you that you are at risk for this type of infection. Your sexual activity has changed since you were last screened, and you are at increased risk for chlamydia or gonorrhea. Ask your health care provider if you are at risk. Ask your health care provider about whether you are at high risk for HIV. Your health care provider may recommend a prescription medicine to help prevent HIV   infection. If you choose to take medicine to prevent HIV, you should first get tested for HIV. You should then be tested every 3 months for as long as you are taking the medicine. Pregnancy If you are about to stop having your period (premenopausal) and you may become pregnant, seek counseling before you get pregnant. Take 400 to 800  micrograms (mcg) of folic acid every day if you become pregnant. Ask for birth control (contraception) if you want to prevent pregnancy. Osteoporosis and menopause Osteoporosis is a disease in which the bones lose minerals and strength with aging. This can result in bone fractures. If you are 39 years old or older, or if you are at risk for osteoporosis and fractures, ask your health care provider if you should: Be screened for bone loss. Take a calcium or vitamin D supplement to lower your risk of fractures. Be given hormone replacement therapy (HRT) to treat symptoms of menopause. Follow these instructions at home: Alcohol use Do not drink alcohol if: Your health care provider tells you not to drink. You are pregnant, may be pregnant, or are planning to become pregnant. If you drink alcohol: Limit how much you have to: 0-1 drink a day. Know how much alcohol is in your drink. In the [place] one drink equals one 12 oz bottle of beer (355 mL), one 5 oz glass of wine (148 mL), or one 1 oz glass of hard liquor (44 mL). Lifestyle Do not use any products that contain nicotine or tobacco. These products include cigarettes, chewing tobacco, and vaping devices, such as e-cigarettes. If you need help quitting, ask your health care provider. Do not use street drugs. Do not share needles. Ask your health care provider for help if you need support or information about quitting drugs. General instructions Schedule regular health, dental, and eye exams. Stay current with your vaccines. Tell your health care provider if: You often feel depressed. You have ever been abused or do not feel safe at home. Summary Adopting a healthy lifestyle and getting preventive care are important in promoting health and wellness. Follow your health care provider's instructions about healthy diet, exercising, and getting tested or screened for diseases. Follow your health care provider's instructions on monitoring your  cholesterol and blood pressure. This information is not intended to replace advice given to you by your health care provider. Make sure you discuss any questions you have with your health care provider. Document Revised: 06/26/2020 Document Reviewed: 06/26/2020 Elsevier Patient Education  [employer] you for enrolling in [place]. Please follow the instructions below to securely access your online medical record. MyChart allows you to send messages to your doctor, view your test results, manage appointments, and more.   How Do I Sign Up? In your Internet browser, go to [employer] and enter [email or web address]. Click on the Sign Up Now link in the Sign In box. You will see the New Member Sign Up page. Enter your MyChart Access Code exactly as it appears below. You will not need to use this code after you've completed the sign-up process. If you do not sign up before the expiration date, you must request a new code.  MyChart Access Code: [email or web address] Expires: 05/02/2022  8:26 AM  Enter your Social Security Number ([id number]) and Date of Birth (mm/dd/yyyy) as indicated and click Submit. You will be taken to the next sign-up page. Create a MyChart ID. This will be your MyChart login ID and cannot be changed, so think  of one that is secure and easy to remember. Create a Scientist, research (physical sciences). You can change your password at any time. Enter your Password Reset Question and Answer. This can be used at a later time if you forget your password.  Enter your e-mail address. You will receive e-mail notification when new information is available in [place]. Click Sign Up. You can now view your medical record.   Additional Information Remember, MyChart is NOT to be used for urgent needs. For medical emergencies, dial 911.

## 2022-03-18 NOTE — Progress Notes (Signed)
I connected with  Earmon Phoenix on 03/18/22 by a audio enabled telemedicine application and verified that I am speaking with the correct person using two identifiers.  Patient Location: Home  Provider Location: Office/Clinic  I discussed the limitations of evaluation and management by telemedicine. The patient expressed understanding and agreed to proceed.  Subjective:   Amy Gonzales is a 77 y.o. female who presents for Medicare Annual (Subsequent) preventive examination.  Review of Systems    Per HPI unless specifically indicated below.  Cardiac Risk Factors include: advanced age (>3men, >49 women);female gender, Essential Hypertension and Familial hypercholesterolemia              Objective:       10/11/2021   10:04 AM 09/24/2021   10:29 AM 03/26/2021   10:20 AM  Vitals with BMI  Height 5\' 5"  5\' 5"  5\' 5"   Weight 158 lbs 10 oz 158 lbs 159 lbs  BMI 26.39 26.29 26.46  Systolic 128 132  Diastolic 80 80 80  Pulse 77 64 68    There were no vitals filed for this visit. There is no height or weight on file to calculate BMI.     03/18/2022   11:26 AM 03/14/2021   12:09 PM 03/13/2020   11:31 AM  Advanced Directives  Does Patient Have a Medical Advance Directive? No No No  Would patient like information on creating a medical advance directive? No - Patient declined No - Patient declined No - Patient declined    Current Medications (verified) Outpatient Encounter Medications as of 03/18/2022  Medication Sig   cholecalciferol (VITAMIN D3) 25 MCG (1000 UNIT) tablet Take 1,000 Units by mouth daily.   Coenzyme Q10 (COQ10) 100 MG CAPS Take 1 capsule by mouth daily.   Evening Primrose Oil 1000 MG CAPS Take 1 capsule by mouth 2 (two) times daily.   fluticasone (FLONASE) 50 MCG/ACT nasal spray Place 1 spray into both nostrils as needed for allergies or rhinitis.   folic acid (FOLVITE) 400 MCG tablet Take 400 mcg by mouth daily.   Glucos-Chondroit-Hyaluron-MSM (GLUCOSAMINE  CHONDROITIN JOINT PO) Take 1 tablet by mouth 2 (two) times daily.   hydrochlorothiazide (MICROZIDE) 12.5 MG capsule Take 1 capsule by mouth daily   Magnesium 250 MG TABS Take 1 tablet by mouth daily.   Multiple Vitamins-Minerals (CENTRUM SILVER PO) Take 1 tablet by mouth daily.   Omega-3 Fatty Acids (FISH OIL) 435 MG CAPS Take 1 capsule (435 mg total) by mouth 2 (two) times daily.   rosuvastatin (CRESTOR) 5 MG tablet TAKE (1) TABLET BY MOUTH EVERY DAY (Patient taking differently: Take 5 mg by mouth. TAKE (1) TABLET BY MOUTH EVERY OTHER DAY)   No facility-administered encounter medications on file as of 03/18/2022.    Allergies (verified) Clavulanic acid   History: Past Medical History:  Diagnosis Date   Hypercholesteremia    Hypertension    Past Surgical History:  Procedure Laterality Date   fatty tumor removed     from Buttocks   LIPOMA EXCISION Right    buttock   MELANOMA EXCISION     face   WISDOM TOOTH EXTRACTION     Family History  Problem Relation Age of Onset   Cancer Paternal Grandfather    Social History   Socioeconomic History   Marital status: Married    Spouse name: Sagal Gayton   Number of children: 0   Years of education: Not on file   Highest education level: Not on file  Occupational History   Occupation: Retired  Tobacco Use   Smoking status: Never   Smokeless tobacco: Never  Vaping Use   Vaping Use: Never used  Substance and Sexual Activity   Alcohol use: No   Drug use: No   Sexual activity: Yes  Other Topics Concern   Not on file  Social History Narrative   Not on file   Social Determinants of Health   Financial Resource Strain: Low Risk  (03/18/2022)   Overall Financial Resource Strain (CARDIA)    Difficulty of Paying Living Expenses: Not hard at all  Food Insecurity: No Food Insecurity (03/18/2022)   Hunger Vital Sign    Worried About Running Out of Food in the Last Year: Never true    [place] in the Last Year: Never true   Transportation Needs: No Transportation Needs (03/18/2022)   PRAPARE - Hydrologist (Medical): No    Lack of Transportation (Non-Medical): No  Physical Activity: Insufficiently Active (03/18/2022)   Exercise Vital Sign    Days of Exercise per Week: 2 days    Minutes of Exercise per Session: 70 min  Stress: No Stress Concern Present (03/18/2022)   [place]    Feeling of Stress : Not at all  Social Connections: [place] (03/18/2022)   Social Connection and Isolation Panel [NHANES]    Frequency of Communication with Friends and Family: More than three times a week    Frequency of Social Gatherings with Friends and Family: More than three times a week    Attends Religious Services: More than 4 times per year    Active Member of [employer] or Organizations: Yes    Attends Music therapist: More than 4 times per year    Marital Status: Married    Tobacco Counseling Counseling given: Not Answered   Clinical Intake:  Pre-visit preparation completed: No  Pain : No/denies pain     Nutritional Status: BMI of 19-24  Normal Nutritional Risks: None  How often do you need to have someone help you when you read instructions, pamphlets, or other written materials from your doctor or pharmacy?: 1 - Never  Diabetic?No     Information entered by :: Donnie Mesa, cMA   Activities of Daily Living    03/18/2022   11:19 AM  In your present state of health, do you have any difficulty performing the following activities:  Hearing? 0  Vision? 0  Difficulty concentrating or making decisions? 0  Walking or climbing stairs? 0  Dressing or bathing? 0  Doing errands, shopping? 0    Patient Care Team: Juline Patch, MD as PCP - General (Family Medicine) Schermerhorn, Gwen Her, MD as Referring Physician (Obstetrics and Gynecology) Anell Barr, OD (Optometry)  Indicate  any recent Medical Services you may have received from other than [facility] providers in the past year (date may be approximate).    The pt was seen at [place] on 01/07/22 for urinary incontinence. Assessment:   This is a routine wellness examination for [place].  Hearing/Vision screen Denies any issues with her hearing.  Denies any vision changes. Annual Eye Exam: [facility]   Dietary issues and exercise activities discussed: Current Exercise Habits: Structured exercise class, Type of exercise: walking;strength training/weights, Time (Minutes): > 60, Frequency (Times/Week): 2, Weekly Exercise (Minutes/Week): 0, Intensity: Moderate, Exercise limited by: None identified   Goals Addressed  None    Depression Screen    03/18/2022   11:18 AM 09/24/2021   10:30 AM 03/26/2021   10:23 AM 03/14/2021   11:31 AM 09/21/2020    9:30 AM 04/05/2020   11:19 AM 03/13/2020   11:30 AM  PHQ 2/9 Scores  PHQ - 2 Score 0 0 0 0 0 0 0  PHQ- 9 Score  0 0  0 0     Fall Risk    03/18/2022   11:18 AM 09/24/2021   10:30 AM 03/14/2021   11:33 AM 09/21/2020    9:30 AM 04/05/2020   11:19 AM  Fall Risk   Falls in the past year? 0 0 0 0 0  Number falls in past yr: 0 0 0 0   Injury with Fall? 0 0 0 0   Risk for fall due to : No Fall Risks No Fall Risks No Fall Risks No Fall Risks   Follow up Falls evaluation completed Falls evaluation completed Falls prevention discussed Falls evaluation completed Falls evaluation completed    FALL RISK PREVENTION PERTAINING TO THE HOME:  Any stairs in or around the home? Yes  If so, are there any without handrails? No  Home free of loose throw rugs in walkways, pet beds, electrical cords, etc? Yes  Adequate lighting in your home to reduce risk of falls? Yes   ASSISTIVE DEVICES UTILIZED TO PREVENT FALLS:  Life alert? No  Use of a cane, walker or w/c?  No Grab bars in the bathroom? No  Shower chair or bench in shower? Yes  Elevated toilet seat or a  handicapped toilet? No   TIMED UP AND GO:  Was the test performed?  unable to perform, virtual appt  .    Cognitive Function:        03/18/2022   11:19 AM  6CIT Screen  What Year? 0 points  What month? 0 points  What time? 0 points  Count back from 20 0 points  Months in reverse 0 points  Repeat phrase 0 points  Total Score 0 points    Immunizations Immunization History  Administered Date(s) Administered   Influenza, High Dose Seasonal PF 11/19/2019   Influenza,inj,Quad PF,6+ Mos 10/20/2018   Influenza-Unspecified 11/19/2020   Moderna Sars-Covid-2 Vaccination 03/26/2019, 04/27/2019, 01/30/2020   Pneumococcal Conjugate-13 02/19/2016   Pneumococcal Polysaccharide-23 03/11/2019   Tdap 02/19/2016    TDAP status: Up to date  Flu Vaccine status: Due, Education has been provided regarding the importance of this vaccine. Advised may receive this vaccine at local pharmacy or Health Dept. Aware to provide a copy of the vaccination record if obtained from local pharmacy or Health Dept. Verbalized acceptance and understanding.  Pneumococcal vaccine status: Up to date  Covid-19 vaccine status: Information provided on how to obtain vaccines.   Qualifies for Shingles Vaccine? Yes   Zostavax completed No   Shingrix Completed?: No.    Education has been provided regarding the importance of this vaccine. Patient has been advised to call insurance company to determine out of pocket expense if they have not yet received this vaccine. Advised may also receive vaccine at local pharmacy or Health Dept. Verbalized acceptance and understanding.  Screening Tests Health Maintenance  Topic Date Due   Zoster Vaccines- Shingrix (1 of 2) Never done   INFLUENZA VACCINE  09/18/2021   COVID-19 Vaccine (4 - 2023-24 season) 10/19/2021   Medicare Annual Wellness (AWV)  03/19/2023   DTaP/Tdap/Td (2 - Td or Tdap) 02/18/2026  Pneumonia Vaccine 84+ Years old  Completed   DEXA SCAN  Completed   HPV  VACCINES  Aged Out   COLONOSCOPY (Pts 45-33yrs Insurance coverage will need to be confirmed)  Discontinued   Hepatitis C Screening  Discontinued    Health Maintenance  Health Maintenance Due  Topic Date Due   Zoster Vaccines- Shingrix (1 of 2) Never done   INFLUENZA VACCINE  09/18/2021   COVID-19 Vaccine (4 - 2023-24 season) 10/19/2021    Colorectal Screening: declined   Mammogram status: Completed 12/31/2021. Repeat every year  Dexa Scan: 02/19/2016  Lung Cancer Screening: (Low Dose CT Chest recommended if Age 57-80 years, 30 pack-year currently smoking OR have quit w/in 15years.) does not qualify.   Lung Cancer Screening Referral: Not applicable   Additional Screening:  Hepatitis C Screening: does not qualify; discontinued   Vision Screening: Recommended annual ophthalmology exams for early detection of glaucoma and other disorders of the eye. Is the patient up to date with their annual eye exam?  Yes  Who is the provider or what is the name of the office in which the patient attends annual eye exams? Dr. Ellin Mayhew  If pt is not established with a provider, would they like to be referred to a provider to establish care? No .   Dental Screening: Recommended annual dental exams for proper oral hygiene  Community Resource Referral / Chronic Care Management: CRR required this visit?  No   CCM required this visit?  No      Plan:     I have personally reviewed and noted the following in the patient's chart:   Medical and social history Use of alcohol, tobacco or illicit drugs  Current medications and supplements including opioid prescriptions. Patient is not currently taking opioid prescriptions. Functional ability and status Nutritional status Physical activity Advanced directives List of other physicians Hospitalizations, surgeries, and ER visits in previous 12 months Vitals Screenings to include cognitive, depression, and falls Referrals and appointments  In  addition, I have reviewed and discussed with patient certain preventive protocols, quality metrics, and best practice recommendations. A written personalized care plan for preventive services as well as general preventive health recommendations were provided to patient.   Ms. Widrig , Thank you for taking time to come for your Medicare Wellness Visit. I appreciate your ongoing commitment to your health goals. Please review the following plan we discussed and let me know if I can assist you in the future.   These are the goals we discussed:  Goals      Increase physical activity     Recommend increasing physical activity to at least 3 days per week         This is a list of the screening recommended for you and due dates:  Health Maintenance  Topic Date Due   Zoster (Shingles) Vaccine (1 of 2) Never done   Flu Shot  09/18/2021   COVID-19 Vaccine (4 - 2023-24 season) 10/19/2021   Medicare Annual Wellness Visit  03/19/2023   DTaP/Tdap/Td vaccine (2 - Td or Tdap) 02/18/2026   Pneumonia Vaccine  Completed   DEXA scan (bone density measurement)  Completed   HPV Vaccine  Aged Out   Colon Cancer Screening  Discontinued   Hepatitis C Screening: USPSTF Recommendation to screen - Ages 56-79 yo.  Discontinued      Wilson Singer, [place]   03/18/2022   Nurse Notes: Approximately 30 minute Non-Face -To-Face Medicare Wellness Visit

## 2022-03-19 ENCOUNTER — Other Ambulatory Visit: Payer: Self-pay | Admitting: Family Medicine

## 2022-03-19 DIAGNOSIS — E7801 Familial hypercholesterolemia: Secondary | ICD-10-CM

## 2022-03-20 ENCOUNTER — Other Ambulatory Visit: Payer: Self-pay | Admitting: Family Medicine

## 2022-03-20 DIAGNOSIS — E7801 Familial hypercholesterolemia: Secondary | ICD-10-CM

## 2022-03-20 NOTE — Telephone Encounter (Signed)
Requested Prescriptions  Pending Prescriptions Disp Refills   rosuvastatin (CRESTOR) 5 MG tablet [Pharmacy Med Name: ROSUVASTATIN CALCIUM 5 MG TAB] 90 tablet 1    Sig: TAKE (1) TABLET BY MOUTH EVERY DAY     Cardiovascular:  Antilipid - Statins 2 Failed - 03/19/2022  1:26 PM      Failed - Lipid Panel in normal range within the last 12 months    Cholesterol, Total  Date Value Ref Range Status  09/24/2021 187 100 - 199 mg/dL Final   LDL Chol Calc (NIH)  Date Value Ref Range Status  09/24/2021 98 0 - 99 mg/dL Final   HDL  Date Value Ref Range Status  09/24/2021 71 >39 mg/dL Final   Triglycerides  Date Value Ref Range Status  09/24/2021 100 0 - 149 mg/dL Final         Passed - Cr in normal range and within 360 days    Creatinine, Ser  Date Value Ref Range Status  03/26/2021 0.81 0.57 - 1.00 mg/dL Final         Passed - Patient is not pregnant      Passed - Valid encounter within last 12 months    Recent Outpatient Visits           5 months ago Acute bacterial rhinosinusitis   [facility] [place], Earley Abide, MD   5 months ago Essential hypertension   [place] [facility] [place], Deanna C, MD   11 months ago Essential hypertension   [place] [facility] [place], Deanna C, MD   1 year ago Acute non-recurrent maxillary sinusitis   [facility] [place], Deanna C, MD   1 year ago Acute non-recurrent maxillary sinusitis   [facility] [place] [facility] [place], [place], MD       Future Appointments             In 1 week Juline Patch, MD [place] [facility]

## 2022-03-27 ENCOUNTER — Ambulatory Visit: Payer: Medicare PPO | Admitting: Family Medicine

## 2022-03-27 ENCOUNTER — Encounter: Payer: Self-pay | Admitting: Family Medicine

## 2022-03-27 VITALS — BP 124/76 | HR 70 | Ht 68.0 in | Wt 158.0 lb

## 2022-03-27 DIAGNOSIS — J301 Allergic rhinitis due to pollen: Secondary | ICD-10-CM

## 2022-03-27 DIAGNOSIS — I10 Essential (primary) hypertension: Secondary | ICD-10-CM | POA: Diagnosis not present

## 2022-03-27 DIAGNOSIS — E7801 Familial hypercholesterolemia: Secondary | ICD-10-CM

## 2022-03-27 MED ORDER — ROSUVASTATIN CALCIUM 5 MG PO TABS: ORAL_TABLET | ORAL | 1 refills | Status: DC

## 2022-03-27 MED ORDER — HYDROCHLOROTHIAZIDE 12.5 MG PO CAPS: ORAL_CAPSULE | ORAL | 1 refills | Status: DC

## 2022-03-27 MED ORDER — FLUTICASONE PROPIONATE 50 MCG/ACT NA SUSP: 1.0000 | NASAL | 1 refills | Status: DC | PRN

## 2022-03-27 NOTE — Progress Notes (Signed)
Date:  03/27/2022   Name:  Amy Gonzales   DOB:  31-Mar-1945   MRN:  [id number]   Chief Complaint: Allergic Rhinitis , Hyperlipidemia, and Hypertension  Hyperlipidemia This is a chronic problem. The current episode started more than 1 year ago. The problem is controlled. Recent lipid tests were reviewed and are normal. She has no history of chronic renal disease, diabetes, hypothyroidism, liver disease, obesity or nephrotic syndrome. There are no known factors aggravating her hyperlipidemia. Pertinent negatives include no chest pain, focal sensory loss, focal weakness, leg pain, myalgias or shortness of breath. Current antihyperlipidemic treatment includes statins. The current treatment provides moderate improvement of lipids. There are no compliance problems.  Risk factors for coronary artery disease include dyslipidemia.  Hypertension This is a chronic problem. The current episode started more than 1 year ago. The problem has been gradually improving since onset. The problem is controlled. Pertinent negatives include no anxiety, blurred vision, chest pain, headaches, malaise/fatigue, neck pain, orthopnea, palpitations, peripheral edema, PND, shortness of breath or sweats. There are no associated agents to hypertension. Past treatments include diuretics. The current treatment provides moderate improvement. There are no compliance problems.  There is no history of chronic renal disease.    Lab Results  Component Value Date   NA 138 03/26/2021   K 4.3 03/26/2021   CO2 24 03/26/2021   GLUCOSE 90 03/26/2021   BUN 12 03/26/2021   CREATININE 0.81 03/26/2021   CALCIUM 9.8 03/26/2021   EGFR 76 03/26/2021   GFRNONAA 69 04/07/2020   Lab Results  Component Value Date   CHOL 187 09/24/2021   HDL 71 09/24/2021   LDLCALC 98 09/24/2021   TRIG 100 09/24/2021   No results found for: "TSH" No results found for: "HGBA1C" No results found for: "WBC", "HGB", "HCT", "MCV", "PLT" Lab Results   Component Value Date   ALT 15 03/26/2021   AST 19 03/26/2021   ALKPHOS 58 03/26/2021   BILITOT 0.4 03/26/2021   No results found for: "25OHVITD2", "25OHVITD3", "VD25OH"   Review of Systems  Constitutional:  Negative for fatigue and malaise/fatigue.  HENT:  Negative for trouble swallowing.   Eyes:  Negative for blurred vision.  Respiratory:  Negative for cough, chest tightness, shortness of breath and wheezing.   Cardiovascular:  Negative for chest pain, palpitations, orthopnea, leg swelling and PND.  Gastrointestinal:  Negative for abdominal pain and blood in stool.  Endocrine: Negative for polydipsia and polyuria.  Genitourinary:  Negative for difficulty urinating and menstrual problem.  Musculoskeletal:  Negative for myalgias and neck pain.  Neurological:  Negative for focal weakness and headaches.    Patient Active Problem List   Diagnosis Date Noted   Acute bacterial rhinosinusitis 10/11/2021    Allergies  Allergen Reactions   Clavulanic Acid Nausea And Vomiting    Can take Amoxicillin    Past Surgical History:  Procedure Laterality Date   fatty tumor removed     from [place] Right    buttock   MELANOMA EXCISION     face   WISDOM TOOTH EXTRACTION      Social History   Tobacco Use   Smoking status: Never   Smokeless tobacco: Never  Vaping Use   Vaping Use: Never used  Substance Use Topics   Alcohol use: No   Drug use: No     Medication list has been reviewed and updated.  Current Meds  Medication Sig   cholecalciferol (VITAMIN D3) 25  MCG (1000 UNIT) tablet Take 1,000 Units by mouth daily.   Coenzyme Q10 (COQ10) 100 MG CAPS Take 1 capsule by mouth daily.   Evening Primrose Oil 1000 MG CAPS Take 1 capsule by mouth 2 (two) times daily.   fluticasone (FLONASE) 50 MCG/ACT nasal spray Place 1 spray into both nostrils as needed for allergies or rhinitis.   folic acid (FOLVITE) 696 MCG tablet Take 400 mcg by mouth daily.    Glucos-Chondroit-Hyaluron-MSM (GLUCOSAMINE CHONDROITIN JOINT PO) Take 1 tablet by mouth 2 (two) times daily.   hydrochlorothiazide (MICROZIDE) 12.5 MG capsule Take 1 capsule by mouth daily   Magnesium 250 MG TABS Take 1 tablet by mouth daily.   Multiple Vitamins-Minerals (CENTRUM SILVER PO) Take 1 tablet by mouth daily.   Omega-3 Fatty Acids (FISH OIL) 435 MG CAPS Take 1 capsule (435 mg total) by mouth 2 (two) times daily.   rosuvastatin (CRESTOR) 5 MG tablet TAKE (1) TABLET BY MOUTH EVERY DAY       03/27/2022   10:01 AM 09/24/2021   10:30 AM 03/26/2021   10:23 AM 09/21/2020    9:30 AM  GAD 7 : Generalized Anxiety Score  Nervous, Anxious, on Edge 0 0 0 0  Control/stop worrying 0 0 0 0  Worry too much - different things 0 0 0 0  Trouble relaxing 0 0 0 0  Restless 0 0 0 0  Easily annoyed or irritable 0 0 0 0  Afraid - awful might happen 0 0 0 0  Total GAD 7 Score 0 0 0 0  Anxiety Difficulty Not difficult at all Not difficult at all Not difficult at all        03/27/2022   10:01 AM 03/18/2022   11:18 AM 09/24/2021   10:30 AM  Depression screen PHQ 2/9  Decreased Interest 0 0 0  Down, Depressed, Hopeless 0 0 0  PHQ - 2 Score 0 0 0  Altered sleeping 0  0  Tired, decreased energy 0  0  Change in appetite 0  0  Feeling bad or failure about yourself  0  0  Trouble concentrating 0  0  Moving slowly or fidgety/restless 0  0  Suicidal thoughts 0  0  PHQ-9 Score 0  0  Difficult doing work/chores Not difficult at all  Not difficult at all    BP Readings from Last 3 Encounters:  03/27/22 124/76  10/11/21 128/80  09/24/21 132/80    Physical Exam Vitals and nursing note reviewed. Exam conducted with a chaperone present.  Constitutional:      General: She is not in acute distress.    Appearance: She is not diaphoretic.  HENT:     Head: Normocephalic and atraumatic.     Right Ear: External ear normal.     Left Ear: External ear normal.     Nose: Nose normal.  Eyes:     General:         Right eye: No discharge.        Left eye: No discharge.     Conjunctiva/sclera: Conjunctivae normal.     Pupils: Pupils are equal, round, and reactive to light.  Neck:     Thyroid: No thyromegaly.     Vascular: No JVD.  Cardiovascular:     Rate and Rhythm: Normal rate and regular rhythm.     Heart sounds: Normal heart sounds. No murmur heard.    No friction rub. No gallop.  Pulmonary:     Effort: Pulmonary  effort is normal.     Breath sounds: Normal breath sounds. No wheezing or rhonchi.  Abdominal:     General: Bowel sounds are normal.     Palpations: Abdomen is soft. There is no mass.     Tenderness: There is no abdominal tenderness. There is no guarding.  Musculoskeletal:        General: Normal range of motion.     Cervical back: Normal range of motion and neck supple.  Lymphadenopathy:     Cervical: No cervical adenopathy.  Skin:    General: Skin is warm and dry.  Neurological:     Mental Status: She is alert.     Sensory: Sensation is intact.     Wt Readings from Last 3 Encounters:  03/27/22 158 lb (71.7 kg)  10/11/21 158 lb 9.6 oz (71.9 kg)  09/24/21 158 lb (71.7 kg)    BP 124/76   Pulse 70   Ht 5\' 8"  (1.727 m)   Wt 158 lb (71.7 kg)   SpO2 96%   BMI 24.02 kg/m   Assessment and Plan: 1. Essential hypertension Chronic.  Controlled.  Stable.  Blood pressure today 124/76.  Asymptomatic.  Tolerating medication well.  Will continue hydrochlorothiazide 12.5 mg once a day.  Will check CMP for electrolytes and GFR.  Will recheck patient in 6 months. - hydrochlorothiazide (MICROZIDE) 12.5 MG capsule; Take 1 capsule by mouth daily  Dispense: 90 capsule; Refill: 1 - Comprehensive Metabolic Panel (CMET)  2. Familial hypercholesterolemia Chronic.  Controlled.  Stable.  Continue rosuvastatin 5 mg once a day.  Will review of previous lipid panel is acceptable and will repeat in 6 months. - rosuvastatin (CRESTOR) 5 MG tablet; TAKE (1) TABLET BY MOUTH EVERY DAY  Dispense: 90  tablet; Refill: 1  3. Seasonal allergic rhinitis due to pollen Chronic.  Controlled.  Stable.  Continue Flonase 1 spray both nostrils daily - fluticasone (FLONASE) 50 MCG/ACT nasal spray; Place 1 spray into both nostrils as needed for allergies or rhinitis.  Dispense: 9.9 mL; Refill: 1     Otilio Miu, MD

## 2022-03-28 LAB — COMPREHENSIVE METABOLIC PANEL
ALT: 11 IU/L (ref 0–32)
AST: 18 IU/L (ref 0–40)
Albumin/Globulin Ratio: 2.1 (ref 1.2–2.2)
Albumin: 4.2 g/dL (ref 3.8–4.8)
Alkaline Phosphatase: 54 IU/L (ref 44–121)
BUN/Creatinine Ratio: 16 (ref 12–28)
BUN: 12 mg/dL (ref 8–27)
Bilirubin Total: 0.4 mg/dL (ref 0.0–1.2)
CO2: 24 mmol/L (ref 20–29)
Calcium: 9.8 mg/dL (ref 8.7–10.3)
Chloride: 100 mmol/L (ref 96–106)
Creatinine, Ser: 0.73 mg/dL (ref 0.57–1.00)
Globulin, Total: 2 g/dL (ref 1.5–4.5)
Glucose: 85 mg/dL (ref 70–99)
Potassium: 4.6 mmol/L (ref 3.5–5.2)
Sodium: 140 mmol/L (ref 134–144)
Total Protein: 6.2 g/dL (ref 6.0–8.5)
eGFR: 85 mL/min/{1.73_m2} (ref >59)

## 2022-04-08 ENCOUNTER — Ambulatory Visit: Payer: Medicare PPO | Admitting: Internal Medicine

## 2022-04-08 ENCOUNTER — Encounter: Payer: Self-pay | Admitting: Internal Medicine

## 2022-04-08 VITALS — BP 122/78 | HR 88 | Temp 98.5°F | Ht 68.0 in | Wt 157.8 lb

## 2022-04-08 DIAGNOSIS — J0100 Acute maxillary sinusitis, unspecified: Secondary | ICD-10-CM | POA: Diagnosis not present

## 2022-04-08 DIAGNOSIS — Z20822 Contact with and (suspected) exposure to covid-19: Secondary | ICD-10-CM | POA: Diagnosis not present

## 2022-04-08 DIAGNOSIS — R6889 Other general symptoms and signs: Secondary | ICD-10-CM

## 2022-04-08 DIAGNOSIS — J029 Acute pharyngitis, unspecified: Secondary | ICD-10-CM | POA: Diagnosis not present

## 2022-04-08 LAB — POC COVID19 BINAXNOW: SARS Coronavirus 2 Ag: NEGATIVE

## 2022-04-08 MED ORDER — AZITHROMYCIN 250 MG PO TABS: ORAL_TABLET | ORAL | 0 refills | Status: AC

## 2022-04-08 NOTE — Progress Notes (Signed)
Date:  04/08/2022   Name:  Amy Gonzales   DOB:  [birth year]   MRN:  [id number]   Chief Complaint: Sinusitis (Started Thursday with drainage in throat. 4 days ago. Pressure in face. Was exposed to Covid Saturday.)  Sinusitis This is a new problem. Episode onset: 4 days ago. There has been no fever. She is experiencing no pain. Associated symptoms include congestion, coughing, sinus pressure and a sore throat. Pertinent negatives include no chills, diaphoresis, headaches or shortness of breath. Past treatments include nasal decongestants and saline nose sprays (mucinex, sudafed, claritin).  She was exposed 5 days ago to Covid.  Symptoms started the next day.  Lab Results  Component Value Date   NA 140 03/27/2022   K 4.6 03/27/2022   CO2 24 03/27/2022   GLUCOSE 85 03/27/2022   BUN 12 03/27/2022   CREATININE 0.73 03/27/2022   CALCIUM 9.8 03/27/2022   EGFR 85 03/27/2022   GFRNONAA 69 04/07/2020   Lab Results  Component Value Date   CHOL 187 09/24/2021   HDL 71 09/24/2021   LDLCALC 98 09/24/2021   TRIG 100 09/24/2021   No results found for: "TSH" No results found for: "HGBA1C" No results found for: "WBC", "HGB", "HCT", "MCV", "PLT" Lab Results  Component Value Date   ALT 11 03/27/2022   AST 18 03/27/2022   ALKPHOS 54 03/27/2022   BILITOT 0.4 03/27/2022   No results found for: "25OHVITD2", "25OHVITD3", "VD25OH"   Review of Systems  Constitutional:  Negative for chills, diaphoresis, fatigue and fever.  HENT:  Positive for congestion, sinus pressure and sore throat.   Respiratory:  Positive for cough. Negative for chest tightness, shortness of breath and wheezing.   Cardiovascular:  Negative for chest pain and leg swelling.  Neurological:  Negative for dizziness, light-headedness and headaches.  Psychiatric/Behavioral:  Negative for dysphoric mood and sleep disturbance. The patient is not nervous/anxious.     Patient Active Problem List   Diagnosis Date Noted   Acute  bacterial rhinosinusitis 10/11/2021    Allergies  Allergen Reactions   Clavulanic Acid Nausea And Vomiting    Can take Amoxicillin    Past Surgical History:  Procedure Laterality Date   fatty tumor removed     from [place] Right    buttock   MELANOMA EXCISION     face   WISDOM TOOTH EXTRACTION      Social History   Tobacco Use   Smoking status: Never   Smokeless tobacco: Never  Vaping Use   Vaping Use: Never used  Substance Use Topics   Alcohol use: No   Drug use: No     Medication list has been reviewed and updated.  Current Meds  Medication Sig   azithromycin (ZITHROMAX Z-PAK) 250 MG tablet UAD   cholecalciferol (VITAMIN D3) 25 MCG (1000 UNIT) tablet Take 1,000 Units by mouth daily.   Coenzyme Q10 (COQ10) 100 MG CAPS Take 1 capsule by mouth daily.   Evening Primrose Oil 1000 MG CAPS Take 1 capsule by mouth 2 (two) times daily.   fluticasone (FLONASE) 50 MCG/ACT nasal spray Place 1 spray into both nostrils as needed for allergies or rhinitis.   folic acid (FOLVITE) A999333 MCG tablet Take 400 mcg by mouth daily.   Glucos-Chondroit-Hyaluron-MSM (GLUCOSAMINE CHONDROITIN JOINT PO) Take 1 tablet by mouth 2 (two) times daily.   hydrochlorothiazide (MICROZIDE) 12.5 MG capsule Take 1 capsule by mouth daily   Magnesium 250 MG TABS Take 1  tablet by mouth daily.   Multiple Vitamins-Minerals (CENTRUM SILVER PO) Take 1 tablet by mouth daily.   Omega-3 Fatty Acids (FISH OIL) 435 MG CAPS Take 1 capsule (435 mg total) by mouth 2 (two) times daily.   rosuvastatin (CRESTOR) 5 MG tablet TAKE (1) TABLET BY MOUTH EVERY DAY       04/08/2022   11:22 AM 03/27/2022   10:01 AM 09/24/2021   10:30 AM 03/26/2021   10:23 AM  GAD 7 : Generalized Anxiety Score  Nervous, Anxious, on Edge 0 0 0 0  Control/stop worrying 0 0 0 0  Worry too much - different things 0 0 0 0  Trouble relaxing 0 0 0 0  Restless 0 0 0 0  Easily annoyed or irritable 0 0 0 0  Afraid - awful might happen  0 0 0 0  Total GAD 7 Score 0 0 0 0  Anxiety Difficulty Not difficult at all Not difficult at all Not difficult at all Not difficult at all       04/08/2022   11:22 AM 03/27/2022   10:01 AM 03/18/2022   11:18 AM  Depression screen PHQ 2/9  Decreased Interest 0 0 0  Down, Depressed, Hopeless 0 0 0  PHQ - 2 Score 0 0 0  Altered sleeping 0 0   Tired, decreased energy 0 0   Change in appetite 0 0   Feeling bad or failure about yourself  0 0   Trouble concentrating 0 0   Moving slowly or fidgety/restless 0 0   Suicidal thoughts 0 0   PHQ-9 Score 0 0   Difficult doing work/chores Not difficult at all Not difficult at all     BP Readings from Last 3 Encounters:  04/08/22 122/78  03/27/22 124/76  10/11/21 128/80    Physical Exam Constitutional:      Appearance: She is well-developed.  HENT:     Right Ear: Ear canal and external ear normal. Tympanic membrane is not erythematous or retracted.     Left Ear: Ear canal and external ear normal. Tympanic membrane is not erythematous or retracted.     Nose:     Right Sinus: Maxillary sinus tenderness and frontal sinus tenderness present.     Left Sinus: Maxillary sinus tenderness and frontal sinus tenderness present.     Mouth/Throat:     Mouth: No oral lesions.     Pharynx: Uvula midline. No oropharyngeal exudate or posterior oropharyngeal erythema.  Cardiovascular:     Rate and Rhythm: Normal rate and regular rhythm.     Heart sounds: Normal heart sounds.  Pulmonary:     Breath sounds: Normal breath sounds. No wheezing or rales.  Lymphadenopathy:     Cervical: No cervical adenopathy.  Neurological:     Mental Status: She is alert and oriented to person, place, and time.     Wt Readings from Last 3 Encounters:  04/08/22 157 lb 12.8 oz (71.6 kg)  03/27/22 158 lb (71.7 kg)  10/11/21 158 lb 9.6 oz (71.9 kg)    BP 122/78   Pulse 88   Temp 98.5 F (36.9 C) (Oral)   Ht 5' 8"$  (1.727 m)   Wt 157 lb 12.8 oz (71.6 kg)   SpO2 96%    BMI 23.99 kg/m   Assessment and Plan: Problem List Items Addressed This Visit   None Visit Diagnoses     Acute non-recurrent maxillary sinusitis    -  Primary   continue Mucinex and  fluids Sudafed, sinus rinses/saline spray follow up if needed   Relevant Medications   azithromycin (ZITHROMAX Z-PAK) 250 MG tablet   Flu-like symptoms       Covid negative   Relevant Orders   POC COVID-19 BinaxNow (Completed)        Partially dictated using Editor, commissioning. Any errors are unintentional.  Halina Maidens, MD [facility]  04/08/2022

## 2022-06-27 ENCOUNTER — Encounter: Payer: Self-pay | Admitting: Family Medicine

## 2022-06-27 ENCOUNTER — Ambulatory Visit: Payer: Medicare PPO | Admitting: Family Medicine

## 2022-06-27 ENCOUNTER — Telehealth: Payer: Self-pay | Admitting: Family Medicine

## 2022-06-27 VITALS — BP 146/100 | HR 69 | Ht 68.0 in | Wt 158.0 lb

## 2022-06-27 DIAGNOSIS — J0100 Acute maxillary sinusitis, unspecified: Secondary | ICD-10-CM | POA: Diagnosis not present

## 2022-06-27 MED ORDER — AMOXICILLIN 500 MG PO CAPS: 500.0000 mg | ORAL_CAPSULE | Freq: Three times a day (TID) | ORAL | 0 refills | Status: AC

## 2022-06-27 NOTE — Progress Notes (Signed)
Date:  06/27/2022   Name:  Amy Gonzales   DOB:  03/17/1945   MRN:  [id number]   Chief Complaint: Sinus Problem (Head pressure- nothing coming up, no nasal drainage, pressure around eyes- taking sudafed and claritin, using nasal spray and rinse- not working)  Sinus Problem This is a chronic problem. The current episode started more than 1 year ago. The problem has been gradually improving since onset. There has been no fever. The pain is moderate. Associated symptoms include congestion, coughing and sinus pressure. Pertinent negatives include no ear pain, shortness of breath, sneezing or sore throat. Past treatments include oral decongestants. The treatment provided moderate relief.    Lab Results  Component Value Date   NA 140 03/27/2022   K 4.6 03/27/2022   CO2 24 03/27/2022   GLUCOSE 85 03/27/2022   BUN 12 03/27/2022   CREATININE 0.73 03/27/2022   CALCIUM 9.8 03/27/2022   EGFR 85 03/27/2022   GFRNONAA 69 04/07/2020   Lab Results  Component Value Date   CHOL 187 09/24/2021   HDL 71 09/24/2021   LDLCALC 98 09/24/2021   TRIG 100 09/24/2021   No results found for: "TSH" No results found for: "HGBA1C" No results found for: "WBC", "HGB", "HCT", "MCV", "PLT" Lab Results  Component Value Date   ALT 11 03/27/2022   AST 18 03/27/2022   ALKPHOS 54 03/27/2022   BILITOT 0.4 03/27/2022   No results found for: "25OHVITD2", "25OHVITD3", "VD25OH"   Review of Systems  HENT:  Positive for congestion and sinus pressure. Negative for ear pain, sneezing and sore throat.   Respiratory:  Positive for cough. Negative for chest tightness, shortness of breath and wheezing.   Cardiovascular:  Negative for chest pain and palpitations.  Gastrointestinal:  Negative for anal bleeding and constipation.    Patient Active Problem List   Diagnosis Date Noted   Acute bacterial rhinosinusitis 10/11/2021    Allergies  Allergen Reactions   Clavulanic Acid Nausea And Vomiting    Can take  Amoxicillin    Past Surgical History:  Procedure Laterality Date   fatty tumor removed     from Buttocks   LIPOMA EXCISION Right    buttock   MELANOMA EXCISION     face   WISDOM TOOTH EXTRACTION      Social History   Tobacco Use   Smoking status: Never   Smokeless tobacco: Never  Vaping Use   Vaping Use: Never used  Substance Use Topics   Alcohol use: No   Drug use: No     Medication list has been reviewed and updated.  Current Meds  Medication Sig   cholecalciferol (VITAMIN D3) 25 MCG (1000 UNIT) tablet Take 1,000 Units by mouth daily.   Coenzyme Q10 (COQ10) 100 MG CAPS Take 1 capsule by mouth daily.   Evening Primrose Oil 1000 MG CAPS Take 1 capsule by mouth 2 (two) times daily.   fluticasone (FLONASE) 50 MCG/ACT nasal spray Place 1 spray into both nostrils as needed for allergies or rhinitis.   folic acid (FOLVITE) 400 MCG tablet Take 400 mcg by mouth daily.   Glucos-Chondroit-Hyaluron-MSM (GLUCOSAMINE CHONDROITIN JOINT PO) Take 1 tablet by mouth 2 (two) times daily.   hydrochlorothiazide (MICROZIDE) 12.5 MG capsule Take 1 capsule by mouth daily   Magnesium 250 MG TABS Take 1 tablet by mouth daily.   Multiple Vitamins-Minerals (CENTRUM SILVER PO) Take 1 tablet by mouth daily.   Omega-3 Fatty Acids (FISH OIL) 435 MG CAPS Take  1 capsule (435 mg total) by mouth 2 (two) times daily.   rosuvastatin (CRESTOR) 5 MG tablet TAKE (1) TABLET BY MOUTH EVERY DAY       04/08/2022   11:22 AM 03/27/2022   10:01 AM 09/24/2021   10:30 AM 03/26/2021   10:23 AM  GAD 7 : Generalized Anxiety Score  Nervous, Anxious, on Edge 0 0 0 0  Control/stop worrying 0 0 0 0  Worry too much - different things 0 0 0 0  Trouble relaxing 0 0 0 0  Restless 0 0 0 0  Easily annoyed or irritable 0 0 0 0  Afraid - awful might happen 0 0 0 0  Total GAD 7 Score 0 0 0 0  Anxiety Difficulty Not difficult at all Not difficult at all Not difficult at all Not difficult at all       04/08/2022   11:22 AM  03/27/2022   10:01 AM 03/18/2022   11:18 AM  Depression screen PHQ 2/9  Decreased Interest 0 0 0  Down, Depressed, Hopeless 0 0 0  PHQ - 2 Score 0 0 0  Altered sleeping 0 0   Tired, decreased energy 0 0   Change in appetite 0 0   Feeling bad or failure about yourself  0 0   Trouble concentrating 0 0   Moving slowly or fidgety/restless 0 0   Suicidal thoughts 0 0   PHQ-9 Score 0 0   Difficult doing work/chores Not difficult at all Not difficult at all     BP Readings from Last 3 Encounters:  06/27/22 (!) 146/100  04/08/22 122/78  03/27/22 124/76    Physical Exam Vitals and nursing note reviewed. Exam conducted with a chaperone present.  Constitutional:      General: She is not in acute distress.    Appearance: She is not diaphoretic.  HENT:     Head: Normocephalic and atraumatic.     Right Ear: Tympanic membrane and external ear normal.     Left Ear: Tympanic membrane and external ear normal.     Nose: No congestion or rhinorrhea.     Right Sinus: Maxillary sinus tenderness present. No frontal sinus tenderness.     Left Sinus: Maxillary sinus tenderness present. No frontal sinus tenderness.     Mouth/Throat:     Pharynx: Oropharynx is clear.  Eyes:     General:        Right eye: No discharge.        Left eye: No discharge.     Conjunctiva/sclera: Conjunctivae normal.     Pupils: Pupils are equal, round, and reactive to light.  Neck:     Thyroid: No thyromegaly.     Vascular: No JVD.  Cardiovascular:     Rate and Rhythm: Normal rate and regular rhythm.     Heart sounds: Normal heart sounds. No murmur heard.    No friction rub. No gallop.  Pulmonary:     Effort: Pulmonary effort is normal.     Breath sounds: Normal breath sounds. No wheezing, rhonchi or rales.  Abdominal:     General: Bowel sounds are normal.     Palpations: Abdomen is soft. There is no mass.     Tenderness: There is no abdominal tenderness. There is no guarding.  Musculoskeletal:        General:  Normal range of motion.     Cervical back: Normal range of motion and neck supple.  Lymphadenopathy:     Cervical:  No cervical adenopathy.  Skin:    General: Skin is warm and dry.  Neurological:     Mental Status: She is alert.     Deep Tendon Reflexes: Reflexes are normal and symmetric.     Wt Readings from Last 3 Encounters:  06/27/22 158 lb (71.7 kg)  04/08/22 157 lb 12.8 oz (71.6 kg)  03/27/22 158 lb (71.7 kg)    BP (!) 146/100   Pulse 69   Ht 5\' 8"  (1.727 m)   Wt 158 lb (71.7 kg)   SpO2 97%   BMI 24.02 kg/m   Assessment and Plan: 1. Acute maxillary sinusitis, recurrence not specified New onset.  Persistent.  Developed soon after she has been working in the yard near the end of pollen season.  Primarily with sinus congestion and pressure and maxillary and frontal sinus areas with tenderness over the maxillary sinuses bilateral.  We will initiate amoxicillin 500 mg 3 times a day for 10 days. - amoxicillin (AMOXIL) 500 MG capsule; Take 1 capsule (500 mg total) by mouth 3 (three) times daily for 10 days.  Dispense: 30 capsule; Refill: 0   Patient does have elevated blood pressure reading however she took a Sudafed on the way over and in review of previous blood pressures they have all been acceptable and most likely this is in fact uptake in pseudoephedrine prior to being seen.  Elizabeth Sauer, MD

## 2022-06-27 NOTE — Telephone Encounter (Signed)
Copied from CRM [id number]. Topic: General - Other >> Jun 27, 2022  8:30 AM Dondra Prader A wrote: Reason for CRM: Pt is calling requesting to speak with Delice Bison. Pt did not give any detail on why she would like to speak with Delice Bison. Please have Delice Bison call pt back.

## 2022-07-01 DIAGNOSIS — Z01818 Encounter for other preprocedural examination: Secondary | ICD-10-CM | POA: Diagnosis not present

## 2022-07-01 DIAGNOSIS — Z1211 Encounter for screening for malignant neoplasm of colon: Secondary | ICD-10-CM | POA: Diagnosis not present

## 2022-07-04 DIAGNOSIS — Z85820 Personal history of malignant melanoma of skin: Secondary | ICD-10-CM | POA: Diagnosis not present

## 2022-07-04 DIAGNOSIS — Z872 Personal history of diseases of the skin and subcutaneous tissue: Secondary | ICD-10-CM | POA: Diagnosis not present

## 2022-07-04 DIAGNOSIS — Z859 Personal history of malignant neoplasm, unspecified: Secondary | ICD-10-CM | POA: Diagnosis not present

## 2022-07-04 DIAGNOSIS — Z86018 Personal history of other benign neoplasm: Secondary | ICD-10-CM | POA: Diagnosis not present

## 2022-07-04 DIAGNOSIS — L570 Actinic keratosis: Secondary | ICD-10-CM | POA: Diagnosis not present

## 2022-07-04 DIAGNOSIS — L578 Other skin changes due to chronic exposure to nonionizing radiation: Secondary | ICD-10-CM | POA: Diagnosis not present

## 2022-09-03 ENCOUNTER — Ambulatory Visit: Payer: Medicare PPO

## 2022-09-03 DIAGNOSIS — K5730 Diverticulosis of large intestine without perforation or abscess without bleeding: Secondary | ICD-10-CM | POA: Diagnosis not present

## 2022-09-03 DIAGNOSIS — Z1211 Encounter for screening for malignant neoplasm of colon: Secondary | ICD-10-CM | POA: Diagnosis not present

## 2022-09-03 DIAGNOSIS — K640 First degree hemorrhoids: Secondary | ICD-10-CM | POA: Diagnosis not present

## 2022-09-26 ENCOUNTER — Ambulatory Visit: Payer: Medicare PPO | Admitting: Family Medicine

## 2022-09-26 ENCOUNTER — Encounter: Payer: Self-pay | Admitting: Family Medicine

## 2022-09-26 VITALS — BP 120/78 | HR 64 | Ht 68.0 in | Wt 158.0 lb

## 2022-09-26 DIAGNOSIS — E7801 Familial hypercholesterolemia: Secondary | ICD-10-CM

## 2022-09-26 DIAGNOSIS — I10 Essential (primary) hypertension: Secondary | ICD-10-CM | POA: Diagnosis not present

## 2022-09-26 DIAGNOSIS — J301 Allergic rhinitis due to pollen: Secondary | ICD-10-CM | POA: Diagnosis not present

## 2022-09-26 MED ORDER — FLUTICASONE PROPIONATE 50 MCG/ACT NA SUSP: 1.0000 | NASAL | Status: AC | PRN

## 2022-09-26 MED ORDER — HYDROCHLOROTHIAZIDE 12.5 MG PO CAPS: ORAL_CAPSULE | ORAL | 1 refills | Status: DC

## 2022-09-26 MED ORDER — ROSUVASTATIN CALCIUM 5 MG PO TABS: ORAL_TABLET | ORAL | 1 refills | Status: DC

## 2022-09-26 NOTE — Progress Notes (Signed)
Date:  09/26/2022   Name:  Amy Gonzales   DOB:  08-28-1945   MRN:  [id number]   Chief Complaint: Allergic Rhinitis , Hypertension, and Hyperlipidemia  Hypertension This is a chronic problem. The current episode started more than 1 year ago. The problem has been gradually improving since onset. The problem is controlled. Pertinent negatives include no blurred vision, chest pain, headaches, palpitations or shortness of breath. Past treatments include diuretics. The current treatment provides moderate improvement. There are no compliance problems.  There is no history of CAD/MI or CVA.  Hyperlipidemia This is a chronic problem. The current episode started more than 1 year ago. The problem is controlled. Recent lipid tests were reviewed and are normal. Factors aggravating her hyperlipidemia include thiazides. Pertinent negatives include no chest pain, myalgias or shortness of breath. Current antihyperlipidemic treatment includes statins. The current treatment provides moderate improvement of lipids.    Lab Results  Component Value Date   NA 140 03/27/2022   K 4.6 03/27/2022   CO2 24 03/27/2022   GLUCOSE 85 03/27/2022   BUN 12 03/27/2022   CREATININE 0.73 03/27/2022   CALCIUM 9.8 03/27/2022   EGFR 85 03/27/2022   GFRNONAA 69 04/07/2020   Lab Results  Component Value Date   CHOL 187 09/24/2021   HDL 71 09/24/2021   LDLCALC 98 09/24/2021   TRIG 100 09/24/2021   No results found for: "TSH" No results found for: "HGBA1C" No results found for: "WBC", "HGB", "HCT", "MCV", "PLT" Lab Results  Component Value Date   ALT 11 03/27/2022   AST 18 03/27/2022   ALKPHOS 54 03/27/2022   BILITOT 0.4 03/27/2022   No results found for: "25OHVITD2", "25OHVITD3", "VD25OH"   Review of Systems  Constitutional:  Negative for fatigue.  HENT:  Negative for trouble swallowing.   Eyes:  Negative for blurred vision.  Respiratory:  Negative for chest tightness, shortness of breath and wheezing.    Cardiovascular:  Negative for chest pain and palpitations.  Musculoskeletal:  Negative for myalgias.  Neurological:  Negative for headaches.    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  Current Meds  Medication Sig   cholecalciferol (VITAMIN D3) 25 MCG (1000 UNIT) tablet Take 1,000 Units by mouth daily.   Coenzyme Q10 (COQ10) 100 MG CAPS Take 1 capsule by mouth daily.   Evening Primrose Oil 1000 MG CAPS Take 1 capsule by mouth 2 (two) times daily.   folic acid (FOLVITE) 400 MCG tablet Take 400 mcg by mouth daily.   Glucos-Chondroit-Hyaluron-MSM (GLUCOSAMINE CHONDROITIN JOINT PO) Take 1 tablet by mouth 2 (two) times daily.   Magnesium 250 MG TABS Take 1 tablet by mouth daily.   Multiple Vitamins-Minerals (CENTRUM SILVER PO) Take 1 tablet by mouth daily.  Omega-3 Fatty Acids (FISH OIL) 435 MG CAPS Take 1 capsule (435 mg total) by mouth 2 (two) times daily.   [DISCONTINUED] fluticasone (FLONASE) 50 MCG/ACT nasal spray Place 1 spray into both nostrils as needed for allergies or rhinitis.   [DISCONTINUED] hydrochlorothiazide (MICROZIDE) 12.5 MG capsule Take 1 capsule by mouth daily   [DISCONTINUED] rosuvastatin (CRESTOR) 5 MG tablet TAKE (1) TABLET BY MOUTH EVERY DAY       09/26/2022    9:20 AM 04/08/2022   11:22 AM 03/27/2022   10:01 AM 09/24/2021   10:30 AM  GAD 7 : Generalized Anxiety Score  Nervous,  Anxious, on Edge 0 0 0 0  Control/stop worrying 0 0 0 0  Worry too much - different things 0 0 0 0  Trouble relaxing 0 0 0 0  Restless 0 0 0 0  Easily annoyed or irritable 0 0 0 0  Afraid - awful might happen 0 0 0 0  Total GAD 7 Score 0 0 0 0  Anxiety Difficulty Not difficult at all Not difficult at all Not difficult at all Not difficult at all       09/26/2022    9:20 AM 04/08/2022   11:22 AM 03/27/2022   10:01 AM  Depression screen PHQ 2/9  Decreased Interest 0 0 0  Down, Depressed, Hopeless 0 0 0  PHQ - 2 Score 0 0 0  Altered sleeping 0 0 0  Tired, decreased energy 0 0 0  Change in appetite 0 0 0  Feeling bad or failure about yourself  0 0 0  Trouble concentrating 0 0 0  Moving slowly or fidgety/restless 0 0 0  Suicidal thoughts 0 0 0  PHQ-9 Score 0 0 0  Difficult doing work/chores Not difficult at all Not difficult at all Not difficult at all    BP Readings from Last 3 Encounters:  09/26/22 120/78  06/27/22 (!) 146/100  04/08/22 122/78    Physical Exam Vitals and nursing note reviewed. Exam conducted with a chaperone present.  Constitutional:      General: She is not in acute distress.    Appearance: She is not diaphoretic.  HENT:     Head: Normocephalic and atraumatic.     Right Ear: Tympanic membrane and external ear normal.     Left Ear: Tympanic membrane and external ear normal.     Nose: Nose normal.  Eyes:     General:        Right eye: No discharge.        Left eye: No discharge.     Conjunctiva/sclera: Conjunctivae normal.     Pupils: Pupils are equal, round, and reactive to light.  Neck:     Thyroid: No thyromegaly.     Vascular: No JVD.  Cardiovascular:     Rate and Rhythm: Normal rate and regular rhythm.     Heart sounds: Normal heart sounds. No murmur heard.    No friction rub. No gallop.  Pulmonary:     Effort: Pulmonary effort is normal.     Breath sounds: Normal breath sounds.  Abdominal:     General: Bowel sounds are normal.      Palpations: Abdomen is soft. There is no mass.     Tenderness: There is no abdominal tenderness. There is no guarding.  Musculoskeletal:        General: Normal range of motion.     Cervical back: Normal range of motion and neck supple.  Lymphadenopathy:  Cervical: No cervical adenopathy.  Skin:    General: Skin is warm and dry.  Neurological:     Mental Status: She is alert.     Deep Tendon Reflexes: Reflexes are normal and symmetric.     Wt Readings from Last 3 Encounters:  09/26/22 158 lb (71.7 kg)  06/27/22 158 lb (71.7 kg)  04/08/22 157 lb 12.8 oz (71.6 kg)    BP 120/78   Pulse 64   Ht 5\' 8"  (1.727 m)   Wt 158 lb (71.7 kg)   SpO2 94%   BMI 24.02 kg/m   Assessment and Plan:  1. Essential hypertension Chronic.  Controlled.  Stable.  Blood pressure 120/78.  Tolerating medication well.  Asymptomatic.  Continue hydrochlorothiazide 12.5 mg once a day.  Will recheck in 6 months. - hydrochlorothiazide (MICROZIDE) 12.5 MG capsule; Take 1 capsule by mouth daily  Dispense: 90 capsule; Refill: 1  2. Seasonal allergic rhinitis due to pollen .  Seasonal.  Stable.  Continue Flonase as directed - fluticasone (FLONASE) 50 MCG/ACT nasal spray; Place 1 spray into both nostrils as needed for allergies or rhinitis.  3. Familial hypercholesterolemia .  Controlled.  Stable.  Asymptomatic without myalgias.  Continue rosuvastatin 5 mg daily.  Will recheck in 6 months.  Will recheck lipid panel for current status of LDL control. - rosuvastatin (CRESTOR) 5 MG tablet; TAKE (1) TABLET BY MOUTH EVERY DAY  Dispense: 90 tablet; Refill: 1 - Lipid Panel With LDL/HDL Ratio    Elizabeth Sauer, MD

## 2022-10-22 ENCOUNTER — Other Ambulatory Visit: Payer: Self-pay | Admitting: Family Medicine

## 2022-10-22 DIAGNOSIS — E7801 Familial hypercholesterolemia: Secondary | ICD-10-CM

## 2022-10-24 ENCOUNTER — Ambulatory Visit: Payer: Medicare PPO | Admitting: Family Medicine

## 2022-10-24 ENCOUNTER — Encounter: Payer: Self-pay | Admitting: Family Medicine

## 2022-10-24 VITALS — BP 128/78 | HR 64 | Temp 98.0°F | Ht 68.0 in | Wt 162.0 lb

## 2022-10-24 DIAGNOSIS — J0100 Acute maxillary sinusitis, unspecified: Secondary | ICD-10-CM

## 2022-10-24 DIAGNOSIS — R051 Acute cough: Secondary | ICD-10-CM | POA: Diagnosis not present

## 2022-10-24 DIAGNOSIS — B349 Viral infection, unspecified: Secondary | ICD-10-CM

## 2022-10-24 LAB — POC COVID19 BINAXNOW: SARS Coronavirus 2 Ag: NEGATIVE

## 2022-10-24 MED ORDER — AMOXICILLIN 500 MG PO CAPS: 500.0000 mg | ORAL_CAPSULE | Freq: Three times a day (TID) | ORAL | 0 refills | Status: AC

## 2022-10-24 MED ORDER — NIRMATRELVIR/RITONAVIR (PAXLOVID)TABLET: 3.0000 | ORAL_TABLET | Freq: Two times a day (BID) | ORAL | 0 refills | Status: DC

## 2022-10-24 MED ORDER — NIRMATRELVIR/RITONAVIR (PAXLOVID)TABLET: 3.0000 | ORAL_TABLET | Freq: Two times a day (BID) | ORAL | 0 refills | Status: AC

## 2022-10-24 NOTE — Progress Notes (Signed)
Date:  10/24/2022   Name:  Amy Gonzales   DOB:  August 06, 1945   MRN:  [id number]   Chief Complaint: Cough (Cough that is dry, pressure in cheeks- taking otc Sudafed and claritin)  Cough This is a new problem. The current episode started yesterday. The problem has been waxing and waning. The cough is Non-productive. Associated symptoms include postnasal drip, rhinorrhea, a sore throat and shortness of breath. Pertinent negatives include no chest pain, ear congestion, ear pain, fever or nasal congestion. She has tried nothing for the symptoms.  Sinusitis This is a new problem. The current episode started yesterday. The problem has been waxing and waning since onset. There has been no fever. Associated symptoms include congestion, coughing, shortness of breath, sinus pressure and a sore throat. Pertinent negatives include no ear pain or sneezing. The treatment provided mild relief.    Lab Results  Component Value Date   NA 140 03/27/2022   K 4.6 03/27/2022   CO2 24 03/27/2022   GLUCOSE 85 03/27/2022   BUN 12 03/27/2022   CREATININE 0.73 03/27/2022   CALCIUM 9.8 03/27/2022   EGFR 85 03/27/2022   GFRNONAA 69 04/07/2020   Lab Results  Component Value Date   CHOL 230 (H) 09/26/2022   HDL 76 09/26/2022   LDLCALC 141 (H) 09/26/2022   TRIG 77 09/26/2022   No results found for: "TSH" No results found for: "HGBA1C" No results found for: "WBC", "HGB", "HCT", "MCV", "PLT" Lab Results  Component Value Date   ALT 11 03/27/2022   AST 18 03/27/2022   ALKPHOS 54 03/27/2022   BILITOT 0.4 03/27/2022   No results found for: "25OHVITD2", "25OHVITD3", "VD25OH"   Review of Systems  Constitutional:  Negative for fever.  HENT:  Positive for congestion, postnasal drip, rhinorrhea, sinus pressure, sinus pain and sore throat. Negative for ear pain and sneezing.   Eyes:  Negative for visual disturbance.  Respiratory:  Positive for cough and shortness of breath.   Cardiovascular:  Negative for  chest pain, palpitations and leg swelling.  Gastrointestinal:  Negative for abdominal distention, abdominal pain and anal bleeding.    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  Current Meds  Medication Sig   cholecalciferol (VITAMIN D3) 25 MCG (1000 UNIT) tablet Take 1,000 Units by mouth daily.   Coenzyme Q10 (COQ10) 100 MG CAPS Take 1 capsule by mouth daily.   Evening Primrose Oil 1000 MG CAPS Take 1 capsule by mouth 2 (two) times daily.   fluticasone (FLONASE) 50 MCG/ACT nasal spray Place 1 spray into both nostrils as needed for allergies or rhinitis.   folic acid (FOLVITE) 400 MCG tablet Take 400 mcg by mouth daily.   Glucos-Chondroit-Hyaluron-MSM (GLUCOSAMINE CHONDROITIN JOINT PO) Take 1 tablet by  mouth 2 (two) times daily.   hydrochlorothiazide (MICROZIDE) 12.5 MG capsule Take 1 capsule by mouth daily   Magnesium 250 MG TABS Take 1 tablet by mouth daily.   Multiple Vitamins-Minerals (CENTRUM SILVER PO) Take 1 tablet by mouth daily.   Omega-3 Fatty Acids (FISH OIL) 435 MG CAPS Take 1 capsule (435 mg total) by mouth 2 (two) times daily.   rosuvastatin (CRESTOR) 5 MG tablet TAKE (1) TABLET BY MOUTH EVERY DAY       10/24/2022    4:03 PM 09/26/2022    9:20 AM 04/08/2022   11:22 AM 03/27/2022   10:01 AM  GAD 7 : Generalized Anxiety Score  Nervous, Anxious, on Edge 0 0 0 0  Control/stop worrying 0 0 0 0   Worry too much - different things 0 0 0 0  Trouble relaxing 0 0 0 0  Restless 0 0 0 0  Easily annoyed or irritable 0 0 0 0  Afraid - awful might happen 0 0 0 0  Total GAD 7 Score 0 0 0 0  Anxiety Difficulty Not difficult at all Not difficult at all Not difficult at all Not difficult at all       10/24/2022    4:03 PM 09/26/2022    9:20 AM 04/08/2022   11:22 AM  Depression screen PHQ 2/9  Decreased Interest 0 0 0  Down, Depressed, Hopeless 0 0 0  PHQ - 2 Score 0 0 0  Altered sleeping 0 0 0  Tired, decreased energy 0 0 0  Change in appetite 0 0 0  Feeling bad or failure about yourself  0 0 0  Trouble concentrating 0 0 0  Moving slowly or fidgety/restless 0 0 0  Suicidal thoughts 0 0 0  PHQ-9 Score 0 0 0  Difficult doing work/chores Not difficult at all Not difficult at all Not difficult at all    BP Readings from Last 3 Encounters:  10/24/22 128/78  09/26/22 120/78  06/27/22 (!) 146/100    Physical Exam Vitals and nursing note reviewed. Exam conducted with a chaperone present.  Constitutional:      General: She is not in acute distress.    Appearance: She is not diaphoretic.  HENT:     Head: Normocephalic and atraumatic.     Right Ear: Tympanic membrane and external ear normal.     Left Ear: Tympanic membrane and external ear normal.     Nose: Nose normal.     Mouth/Throat:     Mouth: Mucous membranes are moist.  Eyes:     General:        Right eye: No discharge.        Left eye: No discharge.     Conjunctiva/sclera: Conjunctivae normal.     Pupils: Pupils are equal, round, and reactive to light.  Neck:     Thyroid: No thyromegaly.     Vascular: No JVD.  Cardiovascular:     Rate and Rhythm: Normal rate and regular rhythm.     Heart sounds: Normal heart sounds. No murmur heard.    No friction rub. No gallop.  Pulmonary:     Effort: Pulmonary effort is normal.     Breath sounds: Normal breath sounds. No wheezing, rhonchi or rales.  Abdominal:     General:  Bowel sounds are normal.     Palpations: Abdomen is soft. There is no mass.     Tenderness: There is no abdominal tenderness. There is no guarding.  Musculoskeletal:  General: Normal range of motion.     Cervical back: Normal range of motion and neck supple.  Lymphadenopathy:     Cervical: No cervical adenopathy.  Skin:    General: Skin is warm and dry.  Neurological:     Mental Status: She is alert.     Deep Tendon Reflexes: Reflexes are normal and symmetric.     Wt Readings from Last 3 Encounters:  10/24/22 162 lb (73.5 kg)  09/26/22 158 lb (71.7 kg)  06/27/22 158 lb (71.7 kg)    BP 128/78   Pulse 64   Temp 98 F (36.7 C) (Oral)   Ht 5\' 8"  (1.727 m)   Wt 162 lb (73.5 kg)   SpO2 96%   BMI 24.63 kg/m   Assessment and Plan: 1. Acute cough New onset.  Persistent.  Stable.  Will check COVID.  COVID was noted to be negative however see below - POC COVID-19 - amoxicillin (AMOXIL) 500 MG capsule; Take 1 capsule (500 mg total) by mouth 3 (three) times daily for 10 days.  Dispense: 30 capsule; Refill: 0  2. Acute maxillary sinusitis, recurrence not specified New onset.  Tenderness over the maxillary sinus left greater than the right.  This is consistent with previous sinus infection we will treat with amoxicillin 500 mg 3 times a day. - amoxicillin (AMOXIL) 500 MG capsule; Take 1 capsule (500 mg total) by mouth 3 (three) times daily for 10 days.  Dispense: 30 capsule; Refill: 0  3. Viral syndrome Patient's spouse currently has positive COVID and is on Paxlovid.  We may be early in the time course and I will be out of town and I have given patient a written prescription for Paxlovid on an as needed basis. - amoxicillin (AMOXIL) 500 MG capsule; Take 1 capsule (500 mg total) by mouth 3 (three) times daily for 10 days.  Dispense: 30 capsule; Refill: 0     Elizabeth Sauer, MD

## 2022-11-05 ENCOUNTER — Encounter: Payer: Self-pay | Admitting: Family Medicine

## 2022-12-09 DIAGNOSIS — J309 Allergic rhinitis, unspecified: Secondary | ICD-10-CM | POA: Diagnosis not present

## 2022-12-09 DIAGNOSIS — R0981 Nasal congestion: Secondary | ICD-10-CM | POA: Diagnosis not present

## 2022-12-09 DIAGNOSIS — J329 Chronic sinusitis, unspecified: Secondary | ICD-10-CM | POA: Diagnosis not present

## 2022-12-10 ENCOUNTER — Ambulatory Visit (INDEPENDENT_AMBULATORY_CARE_PROVIDER_SITE_OTHER): Payer: Medicare PPO

## 2022-12-10 ENCOUNTER — Other Ambulatory Visit: Payer: Self-pay | Admitting: Unknown Physician Specialty

## 2022-12-10 DIAGNOSIS — J301 Allergic rhinitis due to pollen: Secondary | ICD-10-CM | POA: Diagnosis not present

## 2022-12-10 DIAGNOSIS — Z23 Encounter for immunization: Secondary | ICD-10-CM

## 2022-12-10 DIAGNOSIS — J329 Chronic sinusitis, unspecified: Secondary | ICD-10-CM

## 2022-12-10 DIAGNOSIS — R0981 Nasal congestion: Secondary | ICD-10-CM

## 2023-01-07 DIAGNOSIS — H2513 Age-related nuclear cataract, bilateral: Secondary | ICD-10-CM | POA: Diagnosis not present

## 2023-01-08 ENCOUNTER — Other Ambulatory Visit: Payer: Self-pay | Admitting: Obstetrics and Gynecology

## 2023-01-08 DIAGNOSIS — Z1231 Encounter for screening mammogram for malignant neoplasm of breast: Secondary | ICD-10-CM

## 2023-01-13 DIAGNOSIS — N8111 Cystocele, midline: Secondary | ICD-10-CM | POA: Diagnosis not present

## 2023-01-13 DIAGNOSIS — Z01411 Encounter for gynecological examination (general) (routine) with abnormal findings: Secondary | ICD-10-CM | POA: Diagnosis not present

## 2023-01-13 DIAGNOSIS — R300 Dysuria: Secondary | ICD-10-CM | POA: Diagnosis not present

## 2023-01-14 DIAGNOSIS — Z01411 Encounter for gynecological examination (general) (routine) with abnormal findings: Secondary | ICD-10-CM | POA: Diagnosis not present

## 2023-01-14 DIAGNOSIS — R300 Dysuria: Secondary | ICD-10-CM | POA: Diagnosis not present

## 2023-01-14 DIAGNOSIS — N8111 Cystocele, midline: Secondary | ICD-10-CM | POA: Diagnosis not present

## 2023-01-22 ENCOUNTER — Ambulatory Visit
Admission: RE | Admit: 2023-01-22 | Discharge: 2023-01-22 | Disposition: A | Discharge status: Home / Self Care | Payer: Medicare PPO | Source: Ambulatory Visit | Attending: Unknown Physician Specialty | Admitting: Unknown Physician Specialty

## 2023-01-22 DIAGNOSIS — J3489 Other specified disorders of nose and nasal sinuses: Secondary | ICD-10-CM | POA: Diagnosis not present

## 2023-01-22 DIAGNOSIS — R0981 Nasal congestion: Secondary | ICD-10-CM

## 2023-01-22 DIAGNOSIS — J329 Chronic sinusitis, unspecified: Secondary | ICD-10-CM

## 2023-01-23 ENCOUNTER — Other Ambulatory Visit: Payer: Self-pay | Admitting: Family Medicine

## 2023-01-23 DIAGNOSIS — I10 Essential (primary) hypertension: Secondary | ICD-10-CM

## 2023-01-24 NOTE — Telephone Encounter (Signed)
Requested Prescriptions  Pending Prescriptions Disp Refills   hydrochlorothiazide (MICROZIDE) 12.5 MG capsule [Pharmacy Med Name: HYDROCHLOROTHIAZIDE 12.5MG  CAPSULE] 90 capsule 0    Sig: TAKE ONE (1) CAPSULE BY MOUTH DAILY     Cardiovascular: Diuretics - Thiazide Failed - 01/23/2023  3:44 PM      Failed - Cr in normal range and within 180 days    Creatinine, Ser  Date Value Ref Range Status  03/27/2022 0.73 0.57 - 1.00 mg/dL Final         Failed - K in normal range and within 180 days    Potassium  Date Value Ref Range Status  03/27/2022 4.6 3.5 - 5.2 mmol/L Final         Failed - Na in normal range and within 180 days    Sodium  Date Value Ref Range Status  03/27/2022 140 134 - 144 mmol/L Final         Passed - Last BP in normal range    BP Readings from Last 1 Encounters:  10/24/22 128/78         Passed - Valid encounter within last 6 months    Recent Outpatient Visits           3 months ago Acute cough   [facility]   4 months ago Essential hypertension   [facility]   7 months ago Acute maxillary sinusitis, recurrence not specified   [facility]   9 months ago Acute non-recurrent maxillary sinusitis   [facility], Nyoka Cowden, MD   10 months ago Essential hypertension   [facility] [facility]       Future Appointments             In 2 months Duanne Limerick, MD [facility]

## 2023-01-29 ENCOUNTER — Ambulatory Visit
Admission: RE | Admit: 2023-01-29 | Discharge: 2023-01-29 | Disposition: A | Discharge status: Home / Self Care | Payer: Medicare PPO | Source: Ambulatory Visit | Attending: Obstetrics and Gynecology | Admitting: Obstetrics and Gynecology

## 2023-01-29 DIAGNOSIS — Z1231 Encounter for screening mammogram for malignant neoplasm of breast: Secondary | ICD-10-CM | POA: Insufficient documentation

## 2023-02-07 ENCOUNTER — Other Ambulatory Visit: Payer: Self-pay | Admitting: Family Medicine

## 2023-02-07 DIAGNOSIS — E7801 Familial hypercholesterolemia: Secondary | ICD-10-CM

## 2023-03-04 DIAGNOSIS — L821 Other seborrheic keratosis: Secondary | ICD-10-CM | POA: Diagnosis not present

## 2023-03-04 DIAGNOSIS — D485 Neoplasm of uncertain behavior of skin: Secondary | ICD-10-CM | POA: Diagnosis not present

## 2023-03-04 DIAGNOSIS — L820 Inflamed seborrheic keratosis: Secondary | ICD-10-CM | POA: Diagnosis not present

## 2023-03-04 DIAGNOSIS — Z859 Personal history of malignant neoplasm, unspecified: Secondary | ICD-10-CM | POA: Diagnosis not present

## 2023-03-04 DIAGNOSIS — L578 Other skin changes due to chronic exposure to nonionizing radiation: Secondary | ICD-10-CM | POA: Diagnosis not present

## 2023-03-04 DIAGNOSIS — Z86018 Personal history of other benign neoplasm: Secondary | ICD-10-CM | POA: Diagnosis not present

## 2023-03-04 DIAGNOSIS — Z872 Personal history of diseases of the skin and subcutaneous tissue: Secondary | ICD-10-CM | POA: Diagnosis not present

## 2023-03-04 DIAGNOSIS — L570 Actinic keratosis: Secondary | ICD-10-CM | POA: Diagnosis not present

## 2023-03-04 DIAGNOSIS — Z85820 Personal history of malignant melanoma of skin: Secondary | ICD-10-CM | POA: Diagnosis not present

## 2023-03-04 DIAGNOSIS — L814 Other melanin hyperpigmentation: Secondary | ICD-10-CM | POA: Diagnosis not present

## 2023-03-31 ENCOUNTER — Ambulatory Visit: Payer: Self-pay | Admitting: Family Medicine

## 2023-03-31 DIAGNOSIS — Z13820 Encounter for screening for osteoporosis: Secondary | ICD-10-CM | POA: Diagnosis not present

## 2023-03-31 DIAGNOSIS — Z0000 Encounter for general adult medical examination without abnormal findings: Secondary | ICD-10-CM | POA: Diagnosis not present

## 2023-03-31 DIAGNOSIS — Z79899 Other long term (current) drug therapy: Secondary | ICD-10-CM | POA: Diagnosis not present

## 2023-03-31 DIAGNOSIS — R7309 Other abnormal glucose: Secondary | ICD-10-CM | POA: Diagnosis not present

## 2023-03-31 DIAGNOSIS — Z1321 Encounter for screening for nutritional disorder: Secondary | ICD-10-CM | POA: Diagnosis not present

## 2023-03-31 DIAGNOSIS — I10 Essential (primary) hypertension: Secondary | ICD-10-CM | POA: Diagnosis not present

## 2023-03-31 DIAGNOSIS — E538 Deficiency of other specified B group vitamins: Secondary | ICD-10-CM | POA: Diagnosis not present

## 2023-03-31 DIAGNOSIS — Z13220 Encounter for screening for lipoid disorders: Secondary | ICD-10-CM | POA: Diagnosis not present

## 2023-04-01 ENCOUNTER — Other Ambulatory Visit: Payer: Self-pay | Admitting: Internal Medicine

## 2023-04-01 DIAGNOSIS — Z13820 Encounter for screening for osteoporosis: Secondary | ICD-10-CM

## 2023-04-16 DIAGNOSIS — M8588 Other specified disorders of bone density and structure, other site: Secondary | ICD-10-CM | POA: Diagnosis not present

## 2023-07-22 ENCOUNTER — Telehealth: Payer: Self-pay | Admitting: Family Medicine

## 2023-07-22 NOTE — Telephone Encounter (Signed)
 Copied from CRM [id number]. Topic: Medicare AWV >> Jul 22, 2023 11:23 AM Juliana Ocean wrote: Reason for CRM: LVM 07/23/2023 to schedule AWV. Please schedule Virtual or Telehealth visits ONLY.   Rosalee Collins; Care Guide Ambulatory Clinical Support [place] [facility] Direct Dial: [phone]

## 2024-02-27 ENCOUNTER — Other Ambulatory Visit: Payer: Self-pay | Admitting: Obstetrics and Gynecology

## 2024-02-27 DIAGNOSIS — Z1231 Encounter for screening mammogram for malignant neoplasm of breast: Secondary | ICD-10-CM

## 2024-03-25 ENCOUNTER — Ambulatory Visit
Admission: RE | Admit: 2024-03-25 | Discharge: 2024-03-25 | Disposition: A | Source: Ambulatory Visit | Attending: Obstetrics and Gynecology | Admitting: Obstetrics and Gynecology

## 2024-03-25 DIAGNOSIS — Z1231 Encounter for screening mammogram for malignant neoplasm of breast: Secondary | ICD-10-CM
